# Patient Record
Sex: Female | Born: 1975 | Race: White | Hispanic: No | Smoking: Current every day smoker
Health system: Southern US, Community
[De-identification: ages and names within clinical notes are randomized; demographics above are authoritative.]

## PROBLEM LIST (undated history)

## (undated) DIAGNOSIS — G43009 Migraine without aura, not intractable, without status migrainosus: Secondary | ICD-10-CM

## (undated) DIAGNOSIS — F32A Depression, unspecified: Secondary | ICD-10-CM

## (undated) DIAGNOSIS — F329 Major depressive disorder, single episode, unspecified: Secondary | ICD-10-CM

## (undated) DIAGNOSIS — F331 Major depressive disorder, recurrent, moderate: Secondary | ICD-10-CM

## (undated) DIAGNOSIS — F419 Anxiety disorder, unspecified: Secondary | ICD-10-CM

## (undated) HISTORY — DX: Major depressive disorder, recurrent, moderate: F33.1

## (undated) HISTORY — DX: Migraine without aura, not intractable, without status migrainosus: G43.009

---

## 1999-03-15 ENCOUNTER — Other Ambulatory Visit: Admission: RE | Admit: 1999-03-15 | Discharge: 1999-03-15 | Payer: Self-pay | Admitting: Obstetrics and Gynecology

## 1999-09-17 ENCOUNTER — Inpatient Hospital Stay (HOSPITAL_COMMUNITY): Admission: AD | Admit: 1999-09-17 | Discharge: 1999-09-19 | Payer: Self-pay | Admitting: Obstetrics and Gynecology

## 2000-08-28 ENCOUNTER — Other Ambulatory Visit: Admission: RE | Admit: 2000-08-28 | Discharge: 2000-08-28 | Payer: Self-pay | Admitting: Obstetrics and Gynecology

## 2001-09-02 ENCOUNTER — Other Ambulatory Visit: Admission: RE | Admit: 2001-09-02 | Discharge: 2001-09-02 | Payer: Self-pay | Admitting: Obstetrics and Gynecology

## 2003-06-17 HISTORY — PX: TUBAL LIGATION: SHX77

## 2004-10-10 ENCOUNTER — Ambulatory Visit: Payer: Self-pay | Admitting: *Deleted

## 2004-10-24 ENCOUNTER — Ambulatory Visit: Payer: Self-pay | Admitting: Obstetrics and Gynecology

## 2005-01-28 ENCOUNTER — Ambulatory Visit: Payer: Self-pay | Admitting: Obstetrics & Gynecology

## 2005-01-28 ENCOUNTER — Other Ambulatory Visit: Admission: RE | Admit: 2005-01-28 | Discharge: 2005-01-28 | Payer: Self-pay | Admitting: Obstetrics & Gynecology

## 2005-02-13 ENCOUNTER — Ambulatory Visit: Payer: Self-pay | Admitting: Obstetrics and Gynecology

## 2005-09-11 ENCOUNTER — Ambulatory Visit: Payer: Self-pay | Admitting: Family Medicine

## 2014-05-09 ENCOUNTER — Emergency Department (HOSPITAL_COMMUNITY)
Admission: EM | Admit: 2014-05-09 | Discharge: 2014-05-09 | Disposition: A | Discharge status: Home / Self Care | Payer: 59 | Attending: Emergency Medicine | Admitting: Emergency Medicine

## 2014-05-09 ENCOUNTER — Emergency Department (HOSPITAL_COMMUNITY): Payer: 59

## 2014-05-09 ENCOUNTER — Encounter (HOSPITAL_COMMUNITY): Payer: Self-pay

## 2014-05-09 DIAGNOSIS — S0990XA Unspecified injury of head, initial encounter: Secondary | ICD-10-CM | POA: Diagnosis present

## 2014-05-09 DIAGNOSIS — Y9389 Activity, other specified: Secondary | ICD-10-CM | POA: Diagnosis not present

## 2014-05-09 DIAGNOSIS — F419 Anxiety disorder, unspecified: Secondary | ICD-10-CM | POA: Diagnosis not present

## 2014-05-09 DIAGNOSIS — S0003XA Contusion of scalp, initial encounter: Secondary | ICD-10-CM | POA: Diagnosis not present

## 2014-05-09 DIAGNOSIS — Y998 Other external cause status: Secondary | ICD-10-CM | POA: Insufficient documentation

## 2014-05-09 DIAGNOSIS — F329 Major depressive disorder, single episode, unspecified: Secondary | ICD-10-CM | POA: Insufficient documentation

## 2014-05-09 DIAGNOSIS — Z720 Tobacco use: Secondary | ICD-10-CM | POA: Insufficient documentation

## 2014-05-09 DIAGNOSIS — S80811A Abrasion, right lower leg, initial encounter: Secondary | ICD-10-CM

## 2014-05-09 DIAGNOSIS — S92022A Displaced fracture of anterior process of left calcaneus, initial encounter for closed fracture: Secondary | ICD-10-CM | POA: Insufficient documentation

## 2014-05-09 DIAGNOSIS — S80212A Abrasion, left knee, initial encounter: Secondary | ICD-10-CM | POA: Diagnosis not present

## 2014-05-09 DIAGNOSIS — S199XXA Unspecified injury of neck, initial encounter: Secondary | ICD-10-CM | POA: Diagnosis not present

## 2014-05-09 DIAGNOSIS — Z79899 Other long term (current) drug therapy: Secondary | ICD-10-CM | POA: Insufficient documentation

## 2014-05-09 DIAGNOSIS — Y92410 Unspecified street and highway as the place of occurrence of the external cause: Secondary | ICD-10-CM | POA: Diagnosis not present

## 2014-05-09 DIAGNOSIS — S92002A Unspecified fracture of left calcaneus, initial encounter for closed fracture: Secondary | ICD-10-CM

## 2014-05-09 HISTORY — DX: Anxiety disorder, unspecified: F41.9

## 2014-05-09 HISTORY — DX: Major depressive disorder, single episode, unspecified: F32.9

## 2014-05-09 HISTORY — DX: Depression, unspecified: F32.A

## 2014-05-09 LAB — COMPREHENSIVE METABOLIC PANEL
ALT: 26 U/L (ref 0–35)
AST: 27 U/L (ref 0–37)
Albumin: 4.1 g/dL (ref 3.5–5.2)
Alkaline Phosphatase: 77 U/L (ref 39–117)
Anion gap: 12 (ref 5–15)
BUN: 11 mg/dL (ref 6–23)
CO2: 26 mEq/L (ref 19–32)
Calcium: 8.8 mg/dL (ref 8.4–10.5)
Chloride: 104 mEq/L (ref 96–112)
Creatinine, Ser: 0.63 mg/dL (ref 0.50–1.10)
GFR calc Af Amer: >90 mL/min (ref >90)
GFR calc non Af Amer: >90 mL/min (ref >90)
Glucose, Bld: 88 mg/dL (ref 70–99)
Potassium: 4.1 mEq/L (ref 3.7–5.3)
Sodium: 142 mEq/L (ref 137–147)
Total Bilirubin: 0.3 mg/dL (ref 0.3–1.2)
Total Protein: 7.1 g/dL (ref 6.0–8.3)

## 2014-05-09 LAB — CBC
HCT: 39.9 % (ref 36.0–46.0)
Hemoglobin: 13.2 g/dL (ref 12.0–15.0)
MCH: 32 pg (ref 26.0–34.0)
MCHC: 33.1 g/dL (ref 30.0–36.0)
MCV: 96.6 fL (ref 78.0–100.0)
Platelets: 268 10*3/uL (ref 150–400)
RBC: 4.13 MIL/uL (ref 3.87–5.11)
RDW: 13.1 % (ref 11.5–15.5)
WBC: 10.7 10*3/uL — ABNORMAL HIGH (ref 4.0–10.5)

## 2014-05-09 MED ORDER — OXYCODONE-ACETAMINOPHEN 5-325 MG PO TABS: 1.0000 | ORAL_TABLET | ORAL | Status: DC | PRN

## 2014-05-09 MED ORDER — IBUPROFEN 600 MG PO TABS: 600.0000 mg | ORAL_TABLET | Freq: Four times a day (QID) | ORAL | Status: DC | PRN

## 2014-05-09 MED ORDER — KETOROLAC TROMETHAMINE 30 MG/ML IJ SOLN
30.0000 mg | Freq: Once | INTRAMUSCULAR | Status: AC
  Administered 2014-05-09: 30 mg via INTRAVENOUS
  Filled 2014-05-09: qty 1

## 2014-05-09 MED ORDER — HYDROMORPHONE HCL 1 MG/ML IJ SOLN
1.0000 mg | Freq: Once | INTRAMUSCULAR | Status: AC
  Administered 2014-05-09: 1 mg via INTRAVENOUS
  Filled 2014-05-09: qty 1

## 2014-05-09 NOTE — ED Notes (Signed)
Per patient request spoke with pt work, [facility] in [place] to let them know she was not going to be there.
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
## 2014-05-09 NOTE — Progress Notes (Signed)
Orthopedic Tech Progress Note Patient Details:  Regina Duran November 17, 1975 [id number]  Ortho Devices Type of Ortho Device: Soft collar Ortho Device/Splint Location: neck Ortho Device/Splint Interventions: Application   Esterlene Atiyeh 05/09/2014, 10:10 AM

## 2014-05-09 NOTE — ED Notes (Signed)
Spoke with Pt.s friend Joelene Millin, and reported to her that pt. Is at [facility] and for her to contact her mother.  Joelene Millin verbalized understanding

## 2014-05-09 NOTE — Progress Notes (Signed)
Orthopedic Tech Progress Note Patient Details:  LEROY TRIM 21-May-1976 [id number]  Ortho Devices Type of Ortho Device: Ace wrap, Crutches, Post (short leg) splint Ortho Device/Splint Location: lle Ortho Device/Splint Interventions: Application   Harshal Sirmon 05/09/2014, 2:05 PM

## 2014-05-09 NOTE — ED Notes (Signed)
Ice Pak applied to head and lt. Foot/ankle
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
## 2015-03-15 ENCOUNTER — Encounter: Payer: Self-pay | Admitting: Neurology

## 2015-03-15 ENCOUNTER — Ambulatory Visit (INDEPENDENT_AMBULATORY_CARE_PROVIDER_SITE_OTHER): Payer: 59 | Admitting: Neurology

## 2015-03-15 VITALS — BP 98/62 | HR 60 | Resp 16 | Ht 64.0 in | Wt 122.0 lb

## 2015-03-15 DIAGNOSIS — R202 Paresthesia of skin: Secondary | ICD-10-CM

## 2015-03-15 DIAGNOSIS — M79609 Pain in unspecified limb: Secondary | ICD-10-CM | POA: Diagnosis not present
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
## 2015-03-15 NOTE — Patient Instructions (Signed)
Your exam looks normal which is reassuring.   We will await your records from [facility]. Let me review records and we will let you know if you need further work up. For now, let's play it by ear. Symptoms may still get better with time, however, with tingling/pins and needles, predictions are difficult. On the positive side, you have not had progressive symptoms.

## 2015-04-04 ENCOUNTER — Telehealth: Payer: Self-pay | Admitting: Neurology

## 2015-04-04 NOTE — Telephone Encounter (Signed)
Pt called and is wondering if this office received her medical records from [facility]. She was told by the physician that her care could not continue until then. She would like to know what her next step is . Please call and advise [phone]

## 2015-04-04 NOTE — Telephone Encounter (Signed)
Looks like some notes have been scanned into chart. How would you like to proceed?

## 2015-04-05 NOTE — Telephone Encounter (Signed)
I spoke to patient and she is aware of recommendation. She was appreciative of advice. She will discuss with PCP about getting referral but will call us back if needed.

## 2015-04-05 NOTE — Telephone Encounter (Signed)
I reviewed records. She had an appropriate w/u in the form of recent EMG which showed evidence of pinched nerve and an MRI of the neck, which showed some degenerative changes. The best next option is to see a spine doctor, through orthopedics or neurosurgery. She is encouraged to discuss with PCP also. If she wishes, I can make a referral.

## 2015-06-25 DIAGNOSIS — F32A Depression, unspecified: Secondary | ICD-10-CM | POA: Insufficient documentation

## 2015-11-20 ENCOUNTER — Telehealth: Payer: Self-pay | Admitting: Neurology

## 2015-11-21 NOTE — Telephone Encounter (Signed)
Medical records faxed to [facility] and [facility] at law dg
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
## 2016-11-20 DIAGNOSIS — M7711 Lateral epicondylitis, right elbow: Secondary | ICD-10-CM | POA: Diagnosis not present

## 2017-02-28 DIAGNOSIS — Z23 Encounter for immunization: Secondary | ICD-10-CM | POA: Diagnosis not present

## 2017-04-15 DIAGNOSIS — Z0000 Encounter for general adult medical examination without abnormal findings: Secondary | ICD-10-CM | POA: Diagnosis not present

## 2017-04-15 DIAGNOSIS — Z6822 Body mass index (BMI) 22.0-22.9, adult: Secondary | ICD-10-CM | POA: Diagnosis not present

## 2017-04-15 DIAGNOSIS — Z1231 Encounter for screening mammogram for malignant neoplasm of breast: Secondary | ICD-10-CM | POA: Diagnosis not present

## 2017-04-20 ENCOUNTER — Other Ambulatory Visit: Payer: Self-pay | Admitting: Medical

## 2017-04-20 DIAGNOSIS — Z1231 Encounter for screening mammogram for malignant neoplasm of breast: Secondary | ICD-10-CM

## 2017-05-20 ENCOUNTER — Ambulatory Visit
Admission: RE | Admit: 2017-05-20 | Discharge: 2017-05-20 | Disposition: A | Discharge status: Home / Self Care | Payer: 59 | Source: Ambulatory Visit | Attending: Medical | Admitting: Medical

## 2017-05-20 DIAGNOSIS — Z1231 Encounter for screening mammogram for malignant neoplasm of breast: Secondary | ICD-10-CM | POA: Diagnosis not present

## 2017-05-20 LAB — HM MAMMOGRAPHY: HM Mammogram: NORMAL (ref 0–4)

## 2017-06-16 HISTORY — PX: OTHER SURGICAL HISTORY: SHX169

## 2017-10-06 DIAGNOSIS — R5383 Other fatigue: Secondary | ICD-10-CM | POA: Diagnosis not present

## 2017-10-30 DIAGNOSIS — R1110 Vomiting, unspecified: Secondary | ICD-10-CM | POA: Diagnosis not present

## 2017-10-30 DIAGNOSIS — R1031 Right lower quadrant pain: Secondary | ICD-10-CM | POA: Diagnosis not present

## 2017-10-30 DIAGNOSIS — N83201 Unspecified ovarian cyst, right side: Secondary | ICD-10-CM | POA: Diagnosis not present

## 2017-11-06 DIAGNOSIS — M4802 Spinal stenosis, cervical region: Secondary | ICD-10-CM

## 2017-11-06 HISTORY — DX: Spinal stenosis, cervical region: M48.02

## 2018-01-01 DIAGNOSIS — N838 Other noninflammatory disorders of ovary, fallopian tube and broad ligament: Secondary | ICD-10-CM

## 2018-01-01 HISTORY — DX: Other noninflammatory disorders of ovary, fallopian tube and broad ligament: N83.8

## 2018-01-19 DIAGNOSIS — N83291 Other ovarian cyst, right side: Secondary | ICD-10-CM | POA: Diagnosis not present

## 2018-01-19 DIAGNOSIS — D270 Benign neoplasm of right ovary: Secondary | ICD-10-CM | POA: Diagnosis not present

## 2018-01-19 DIAGNOSIS — N83201 Unspecified ovarian cyst, right side: Secondary | ICD-10-CM | POA: Diagnosis not present

## 2018-04-21 DIAGNOSIS — Z6820 Body mass index (BMI) 20.0-20.9, adult: Secondary | ICD-10-CM | POA: Diagnosis not present

## 2018-04-21 DIAGNOSIS — Z01419 Encounter for gynecological examination (general) (routine) without abnormal findings: Secondary | ICD-10-CM | POA: Diagnosis not present

## 2018-04-21 DIAGNOSIS — Z0000 Encounter for general adult medical examination without abnormal findings: Secondary | ICD-10-CM | POA: Diagnosis not present

## 2018-04-21 LAB — HM PAP SMEAR: HM Pap smear: POSITIVE

## 2018-05-17 DIAGNOSIS — Z01419 Encounter for gynecological examination (general) (routine) without abnormal findings: Secondary | ICD-10-CM | POA: Diagnosis not present

## 2018-05-17 DIAGNOSIS — Z0000 Encounter for general adult medical examination without abnormal findings: Secondary | ICD-10-CM | POA: Diagnosis not present

## 2019-08-08 ENCOUNTER — Other Ambulatory Visit: Payer: Self-pay

## 2019-08-08 ENCOUNTER — Ambulatory Visit: Payer: 59 | Admitting: Nurse Practitioner

## 2019-08-08 ENCOUNTER — Encounter: Payer: Self-pay | Admitting: Nurse Practitioner

## 2019-08-08 VITALS — BP 98/68 | HR 124 | Temp 95.7°F | Resp 20 | Ht 63.0 in | Wt 125.0 lb

## 2019-08-08 DIAGNOSIS — R110 Nausea: Secondary | ICD-10-CM | POA: Insufficient documentation

## 2019-08-08 DIAGNOSIS — R002 Palpitations: Secondary | ICD-10-CM

## 2019-08-08 DIAGNOSIS — R9431 Abnormal electrocardiogram [ECG] [EKG]: Secondary | ICD-10-CM

## 2019-08-08 DIAGNOSIS — F331 Major depressive disorder, recurrent, moderate: Secondary | ICD-10-CM

## 2019-08-08 DIAGNOSIS — R5383 Other fatigue: Secondary | ICD-10-CM

## 2019-08-08 HISTORY — DX: Other fatigue: R53.83

## 2019-08-08 HISTORY — DX: Abnormal electrocardiogram (ECG) (EKG): R94.31

## 2019-08-08 HISTORY — DX: Nausea: R11.0

## 2019-08-08 MED ORDER — ONDANSETRON HCL 4 MG PO TABS: 4.0000 mg | ORAL_TABLET | Freq: Three times a day (TID) | ORAL | 0 refills | Status: DC | PRN

## 2019-08-08 MED ORDER — VIIBRYD 40 MG PO TABS: 40.0000 mg | ORAL_TABLET | Freq: Every day | ORAL | 0 refills | Status: DC

## 2019-08-08 NOTE — Assessment & Plan Note (Addendum)
Stat troponin levels collected, patient referred to cardiologist

## 2019-08-08 NOTE — Progress Notes (Signed)
Acute Office Visit  Subjective:    Patient ID: Regina Duran, female    DOB: [birth year], 44 y.o.   MRN: [id number]  Chief Complaint  Patient presents with  . Nausea  . Fatigue   Patient is in today for Patietnt  is a 8 old  female  She  is here for nausea/fatigue.  The patient's medications were reviewed and reconciled since the patient's last visit.  History details were provided by the patient. The history appears to be   Nausea: Patient complains of nausea. The nausea is described as moderate , and is  onset was about 4 weeks ago. Symptoms have been, increased lethargy, increase paulse, decrease appetite. Nothing makes it worse or better. The patient denies changes in life style, eating habit, or new stressors.   Fatigue: Patient complains of fatigue. Symptoms began about 3-4 weeks ago. [place]   Symptoms of her fatigue have been increase sleep, decrease apatite, and tachycardia Patient describes the following psychologic symptoms: depression.  Patient denies fever,   Past Medical History:  Diagnosis Date  . Anxiety   . Depression   . Major depressive disorder, recurrent, moderate ([place])   . Migraine without aura, not intractable, without status migrainosus     Past Surgical History:  Procedure Laterality Date  . Oophroectomy Right 06/16/2017   Right ovary removal  . TUBAL LIGATION  2005  . TUBAL LIGATION Bilateral 06/17/2003    Family History  Problem Relation Age of Onset  . Breast cancer Maternal Grandmother     Social History   Socioeconomic History  . Marital status: Legally Separated    Spouse name: Not on file  . Number of children: 3  . Years of education: 72  . Highest education level: Not on file  Occupational History  . Occupation: [employer]  Tobacco Use  . Smoking status: Former Smoker    Packs/day: 1.00    Types: Cigarettes    Quit date: 2019    Years since quitting: 2.1  . Smokeless tobacco: Never Used  Substance and Sexual Activity  .  Alcohol use: Not Currently  . Drug use: Never  . Sexual activity: Not on file  Other Topics Concern  . Not on file  Social History Narrative   ** Merged History Encounter **       Drinks a couple cups of coffee a day, also may have a soda or glass of tea a day   Social Determinants of Radio broadcast assistant Strain:   . Difficulty of Paying Living Expenses: Not on file  Food Insecurity:   . Worried About Charity fundraiser in the Last Year: Not on file  . Ran Out of Food in the Last Year: Not on file  Transportation Needs:   . Lack of Transportation (Medical): Not on file  . Lack of Transportation (Non-Medical): Not on file  Physical Activity:   . Days of Exercise per Week: Not on file  . Minutes of Exercise per Session: Not on file  Stress:   . Feeling of Stress : Not on file  Social Connections:   . Frequency of Communication with Friends and Family: Not on file  . Frequency of Social Gatherings with Friends and Family: Not on file  . Attends Religious Services: Not on file  . Active Member of Clubs or Organizations: Not on file  . Attends Archivist Meetings: Not on file  . Marital Status: Not on file  Intimate Partner  Violence:   . Fear of Current or Ex-Partner: Not on file  . Emotionally Abused: Not on file  . Physically Abused: Not on file  . Sexually Abused: Not on file    Outpatient Medications Prior to Visit  Medication Sig Dispense Refill  . buPROPion (WELLBUTRIN XL) 300 MG 24 hr tablet Take 300 mg by mouth daily.    . Iron, Ferrous Sulfate, 142 (45 Fe) MG TBCR Take 1 tablet by mouth daily.    Marland Kitchen acyclovir (ZOVIRAX) 200 MG capsule      No facility-administered medications prior to visit.    Allergies  Allergen Reactions  . Sulfa Antibiotics Nausea And Vomiting  . Sulfur Nausea Only    Review of Systems  Constitutional: Positive for activity change, appetite change, diaphoresis, fatigue, fever and unexpected weight change. Negative for  chills.  HENT: Negative for congestion, dental problem, drooling, ear discharge, ear pain, facial swelling, hearing loss, sinus pain, sneezing, sore throat and trouble swallowing.   Eyes: Negative for pain, discharge, redness and itching.  Respiratory: Negative for apnea, cough, choking, chest tightness and shortness of breath.   Cardiovascular: Negative for chest pain and palpitations.  Gastrointestinal: Positive for nausea. Negative for abdominal distention and abdominal pain.  Endocrine: Positive for heat intolerance.  Genitourinary: Negative for difficulty urinating.  Musculoskeletal: Negative for arthralgias and myalgias.  Skin: Negative for rash.  Neurological: Negative for headaches.  Psychiatric/Behavioral: Negative for agitation and confusion.       Objective:    Physical Exam Constitutional:      Appearance: Normal appearance.  HENT:     Head: Normocephalic.     Right Ear: Tympanic membrane, ear canal and external ear normal.     Left Ear: Tympanic membrane, ear canal and external ear normal.     Nose: Nose normal.     Mouth/Throat:     Mouth: Mucous membranes are moist.     Pharynx: Oropharynx is clear.  Eyes:     Conjunctiva/sclera: Conjunctivae normal.     Pupils: Pupils are equal, round, and reactive to light.  Cardiovascular:     Rate and Rhythm: Regular rhythm. Tachycardia present.     Heart sounds: Normal heart sounds.  Pulmonary:     Effort: Pulmonary effort is normal.     Breath sounds: Normal breath sounds.  Abdominal:     General: There is no distension.     Palpations: Abdomen is soft.     Tenderness: There is no abdominal tenderness.  Musculoskeletal:        General: Normal range of motion.     Cervical back: Normal range of motion and neck supple. No rigidity or tenderness.  Skin:    General: Skin is warm.     Findings: No bruising.  Neurological:     General: No focal deficit present.     Mental Status: She is alert.     Motor: No weakness.       Gait: Gait normal.  Psychiatric:        Mood and Affect: Mood normal.        Behavior: Behavior normal.     BP 98/68 (BP Location: Right Arm, Patient Position: Sitting)   Pulse (!) 124   Temp (!) 95.7 F (35.4 C) (Temporal)   Resp 20   Ht 5\' 3"  (1.6 m)   Wt 125 lb (56.7 kg)   SpO2 99%   BMI 22.14 kg/m  Wt Readings from Last 3 Encounters:  08/08/19 125 lb (56.7  kg)  03/15/15 122 lb (55.3 kg)    Health Maintenance Due  Topic Date Due  . HIV Screening  09/28/1988    There are no preventive care reminders to display for this patient.   Lab Results  Component Value Date   TSH 1.300 08/08/2019   Lab Results  Component Value Date   WBC 8.8 08/08/2019   HGB 14.3 08/08/2019   HCT 40.8 08/08/2019   MCV 92 08/08/2019   PLT 325 08/08/2019   Lab Results  Component Value Date   NA 144 08/08/2019   K 4.2 08/08/2019   CO2 24 08/08/2019   GLUCOSE 92 08/08/2019   BUN 10 08/08/2019   CREATININE 0.84 08/08/2019   BILITOT 0.3 08/08/2019   ALKPHOS 82 08/08/2019   AST 19 08/08/2019   ALT 8 08/08/2019   PROT 7.7 08/08/2019   ALBUMIN 4.7 08/08/2019   CALCIUM 10.1 08/08/2019   ANIONGAP 12 05/09/2014   No results found for: CHOL No results found for: HDL No results found for: LDLCALC No results found for: TRIG No results found for: CHOLHDL No results found for: HGBA1C     Assessment & Plan:   Problem List Items Addressed This Visit      Other   Nausea - Primary    Patient started on  Anti-nausea medication.  Continue to work on eating bland diet. Avoid spicy and greasy food until nausea subsides. Follow up with worsening symptoms   Rx for nausea medication sent to pharmacy,      Relevant Medications   ondansetron (ZOFRAN) 4 MG tablet   Other fatigue    New complain,  Labs drawn today.  TSH, CMP, B12, CBC, collected. Pending results      Relevant Orders   CBC with Differential/Platelet (Completed)   TSH (Completed)   Vitamin B12 (Completed)    Comprehensive metabolic panel (Completed)   Abnormal EKG    Stat troponin levels collected, patient referred to cardiologist      Relevant Orders   Troponin T (Completed)   Specimen status report (Completed)   Ambulatory referral to Cardiology   Stat Troponin T   Moderate episode of recurrent major depressive disorder ([place])    Well controlled.  No changes to medicines.  Continue to work on eating a healthy diet and exercise.        Relevant Medications   VIIBRYD 40 MG TABS   Palpitation    Well controlled.  No changes to medicines.  Continue to work on eating a healthy diet and exercise.        Relevant Medications   Iron, Ferrous Sulfate, 142 (45 Fe) MG TBCR   Other Relevant Orders   Troponin T (Completed)   Ambulatory referral to Cardiology   EKG 12-Lead (Completed)       Meds ordered this encounter  Medications  . VIIBRYD 40 MG TABS    Sig: Take 1 tablet (40 mg total) by mouth daily.    Dispense:  30 tablet    Refill:  0    Order Specific Question:   Supervising Provider    AnswerRochel Brome [id number]  . ondansetron (ZOFRAN) 4 MG tablet    Sig: Take 1 tablet (4 mg total) by mouth every 8 (eight) hours as needed for nausea or vomiting.    Dispense:  20 tablet    Refill:  0    Order Specific Question:   Supervising Provider    AnswerRochel Brome [phone]  Dmarco Baldus M Aquila Menzie, NP 

## 2019-08-08 NOTE — Assessment & Plan Note (Addendum)
Patient started on  Anti-nausea medication.  Continue to work on eating bland diet. Avoid spicy and greasy food until nausea subsides. Follow up with worsening symptoms   Rx for nausea medication sent to pharmacy,

## 2019-08-08 NOTE — Patient Instructions (Addendum)
EKG completed for increase heart rate, fatigue, nausea. Stat troponin. Cbc, TSH, CMP,  and B12 collected.  Fatigue If you have fatigue, you feel tired all the time and have a lack of energy or a lack of motivation. Fatigue may make it difficult to start or complete tasks because of exhaustion. In general, occasional or mild fatigue is often a normal response to activity or life. However, long-lasting (chronic) or extreme fatigue may be a symptom of a medical condition. Follow these instructions at home: General instructions  Watch your fatigue for any changes.  Go to bed and get up at the same time every day.  Avoid fatigue by pacing yourself during the day and getting enough sleep at night.  Maintain a healthy weight. Medicines  Take over-the-counter and prescription medicines only as told by your health care provider.  Take a multivitamin, if told by your health care provider.  Do not use herbal or dietary supplements unless they are approved by your health care provider. Activity   Exercise regularly, as told by your health care provider.  Use or practice techniques to help you relax, such as yoga, tai chi, meditation, or massage therapy. Eating and drinking   Avoid heavy meals in the evening.  Eat a well-balanced diet, which includes lean proteins, whole grains, plenty of fruits and vegetables, and low-fat dairy products.  Avoid consuming too much caffeine.  Avoid the use of alcohol.  Drink enough fluid to keep your urine pale yellow. Lifestyle  Change situations that cause you stress. Try to keep your work and personal schedule in balance.  Do not use any products that contain nicotine or tobacco, such as cigarettes and e-cigarettes. If you need help quitting, ask your health care provider.  Do not use drugs. Contact a health care provider if:  Your fatigue does not get better.  You have a fever.  You suddenly lose or gain weight.  You have  headaches.  You have trouble falling asleep or sleeping through the night.  You feel angry, guilty, anxious, or sad.  You are unable to have a bowel movement (constipation).  Your skin is dry.  You have swelling in your legs or another part of your body. Get help right away if:  You feel confused.  Your vision is blurry.  You feel faint or you pass out.  You have a severe headache.  You have severe pain in your abdomen, your back, or the area between your waist and hips (pelvis).  You have chest pain, shortness of breath, or an irregular or fast heartbeat.  You are unable to urinate, or you urinate less than normal.  You have abnormal bleeding, such as bleeding from the rectum, vagina, nose, lungs, or nipples.  You vomit blood.  You have thoughts about hurting yourself or others. If you ever feel like you may hurt yourself or others, or have thoughts about taking your own life, get help right away. You can go to your nearest emergency department or call:  Your local emergency services (911 in the [place]  A suicide crisis helpline, such as the [place] at 929-847-5843. This is open 24 hours a day. Summary  If you have fatigue, you feel tired all the time and have a lack of energy or a lack of motivation.  Fatigue may make it difficult to start or complete tasks because of exhaustion.  Long-lasting (chronic) or extreme fatigue may be a symptom of a medical condition.  Exercise regularly,  as told by your health care provider.  Change situations that cause you stress. Try to keep your work and personal schedule in balance. This information is not intended to replace advice given to you by your health care provider. Make sure you discuss any questions you have with your health care provider. Document Revised: 12/22/2018 Document Reviewed: 02/25/2017 Elsevier Patient Education  [place].  Nausea, Adult Nausea is feeling sick to  your stomach or feeling that you are about to throw up (vomit). Feeling sick to your stomach is usually not serious, but it may be an early sign of a more serious medical problem. As you feel sicker to your stomach, you may throw up. If you throw up, or if you are not able to drink enough fluids, there is a risk that you may lose too much water in your body (get dehydrated). If you lose too much water in your body, you may:  Feel tired.  Feel thirsty.  Have a dry mouth.  Have cracked lips.  Go pee (urinate) less often. Older adults and people who have other diseases or a weak body defense system (immune system) have a higher risk of losing too much water in the body. The main goals of treating this condition are:  To relieve your nausea.  To ensure your nausea occurs less often.  To prevent throwing up and losing too much fluid. Follow these instructions at home: Watch your symptoms for any changes. Tell your doctor about them. Follow these instructions as told by your doctor. Eating and drinking      Take an ORS (oral rehydration solution). This is a drink that is sold at pharmacies and stores.  Drink clear fluids in small amounts as you are able. These include: ? Water. ? Ice chips. ? Fruit juice that has water added (diluted fruit juice). ? Low-calorie sports drinks.  Eat bland, easy-to-digest foods in small amounts as you are able, such as: ? Bananas. ? Applesauce. ? Rice. ? Low-fat (lean) meats. ? Toast. ? Crackers.  Avoid drinking fluids that have a lot of sugar or caffeine in them. This includes energy drinks, sports drinks, and soda.  Avoid alcohol.  Avoid spicy or fatty foods. General instructions  Take over-the-counter and prescription medicines only as told by your doctor.  Rest at home while you get better.  Drink enough fluid to keep your pee (urine) pale yellow.  Take slow and deep breaths when you feel sick to your stomach.  Avoid food or  things that have strong smells.  Wash your hands often with soap and water. If you cannot use soap and water, use hand sanitizer.  Make sure that all people in your home wash their hands well and often.  Keep all follow-up visits as told by your doctor. This is important. Contact a doctor if:  You feel sicker to your stomach.  You feel sick to your stomach for more than 2 days.  You throw up.  You are not able to drink fluids without throwing up.  You have new symptoms.  You have a fever.  You have a headache.  You have muscle cramps.  You have a rash.  You have pain while peeing.  You feel light-headed or dizzy. Get help right away if:  You have pain in your chest, neck, arm, or jaw.  You feel very weak or you pass out (faint).  You have throw up that is bright red or looks like coffee grounds.  You have bloody or black poop (stools) or poop that looks like tar.  You have a very bad headache, a stiff neck, or both.  You have very bad pain, cramping, or bloating in your belly (abdomen).  You have trouble breathing or you are breathing very quickly.  Your heart is beating very quickly.  Your skin feels cold and clammy.  You feel confused.  You have signs of losing too much water in your body, such as: ? Dark pee, very little pee, or no pee. ? Cracked lips. ? Dry mouth. ? Sunken eyes. ? Sleepiness. ? Weakness. These symptoms may be an emergency. Do not wait to see if the symptoms will go away. Get medical help right away. Call your local emergency services (911 in the [place] Do not drive yourself to the hospital. Summary  Nausea is feeling sick to your stomach or feeling that you are about to throw up (vomit).  If you throw up, or if you are not able to drink enough fluids, there is a risk that you may lose too much water in your body (get dehydrated).  Eat and drink what your doctor tells you. Take over-the-counter and prescription medicines only as  told by your doctor.  Contact a doctor right away if your symptoms get worse or you have new symptoms.  Keep all follow-up visits as told by your doctor. This is important. This information is not intended to replace advice given to you by your health care provider. Make sure you discuss any questions you have with your health care provider. Document Revised: 11/10/2017 Document Reviewed: 11/10/2017 Elsevier Patient Education  [place].

## 2019-08-08 NOTE — Assessment & Plan Note (Addendum)
New complain,  Labs drawn today.  TSH, CMP, B12, CBC, collected. Pending results

## 2019-08-09 ENCOUNTER — Telehealth: Payer: Self-pay | Admitting: Nurse Practitioner

## 2019-08-09 DIAGNOSIS — F331 Major depressive disorder, recurrent, moderate: Secondary | ICD-10-CM | POA: Insufficient documentation

## 2019-08-09 DIAGNOSIS — R002 Palpitations: Secondary | ICD-10-CM | POA: Insufficient documentation

## 2019-08-09 HISTORY — DX: Major depressive disorder, recurrent, moderate: F33.1

## 2019-08-09 HISTORY — DX: Palpitations: R00.2

## 2019-08-09 LAB — CBC WITH DIFFERENTIAL/PLATELET
Basophils Absolute: 0.1 10*3/uL (ref 0.0–0.2)
Basos: 1 %
EOS (ABSOLUTE): 0 10*3/uL (ref 0.0–0.4)
Eos: 1 %
Hematocrit: 40.8 % (ref 34.0–46.6)
Hemoglobin: 14.3 g/dL (ref 11.1–15.9)
Immature Grans (Abs): 0 10*3/uL (ref 0.0–0.1)
Immature Granulocytes: 0 %
Lymphocytes Absolute: 2.1 10*3/uL (ref 0.7–3.1)
Lymphs: 24 %
MCH: 32.1 pg (ref 26.6–33.0)
MCHC: 35 g/dL (ref 31.5–35.7)
MCV: 92 fL (ref 79–97)
Monocytes Absolute: 0.8 10*3/uL (ref 0.1–0.9)
Monocytes: 9 %
Neutrophils Absolute: 5.8 10*3/uL (ref 1.4–7.0)
Neutrophils: 65 %
Platelets: 325 10*3/uL (ref 150–450)
RBC: 4.45 x10E6/uL (ref 3.77–5.28)
RDW: 11.9 % (ref 11.7–15.4)
WBC: 8.8 10*3/uL (ref 3.4–10.8)

## 2019-08-09 LAB — SPECIMEN STATUS REPORT

## 2019-08-09 LAB — COMPREHENSIVE METABOLIC PANEL
ALT: 8 IU/L (ref 0–32)
AST: 19 IU/L (ref 0–40)
Albumin/Globulin Ratio: 1.6 (ref 1.2–2.2)
Albumin: 4.7 g/dL (ref 3.8–4.8)
Alkaline Phosphatase: 82 IU/L (ref 39–117)
BUN/Creatinine Ratio: 12 (ref 9–23)
BUN: 10 mg/dL (ref 6–24)
Bilirubin Total: 0.3 mg/dL (ref 0.0–1.2)
CO2: 24 mmol/L (ref 20–29)
Calcium: 10.1 mg/dL (ref 8.7–10.2)
Chloride: 103 mmol/L (ref 96–106)
Creatinine, Ser: 0.84 mg/dL (ref 0.57–1.00)
GFR calc Af Amer: 97 mL/min/{1.73_m2} (ref >59)
GFR calc non Af Amer: 84 mL/min/{1.73_m2} (ref >59)
Globulin, Total: 3 g/dL (ref 1.5–4.5)
Glucose: 92 mg/dL (ref 65–99)
Potassium: 4.2 mmol/L (ref 3.5–5.2)
Sodium: 144 mmol/L (ref 134–144)
Total Protein: 7.7 g/dL (ref 6.0–8.5)

## 2019-08-09 LAB — TSH: TSH: 1.3 u[IU]/mL (ref 0.450–4.500)

## 2019-08-09 LAB — VITAMIN B12: Vitamin B-12: 487 pg/mL (ref 232–1245)

## 2019-08-09 LAB — TROPONIN T

## 2019-08-09 NOTE — Telephone Encounter (Signed)
Patient c/o of ongoing nausea but heart rate is back to normal. Patient advised to go to ED with elevated heart rate, Troponin results pending. Pt educated to take Nausea medication as ordered. Pt verbalize understanding. No changes to plan of care

## 2019-08-09 NOTE — Progress Notes (Signed)
Please call patient , let her know if she has any more  symptoms of elevated heart rate to please go to the ED. Troponin results is still pending. Thanks  OI

## 2019-08-11 ENCOUNTER — Other Ambulatory Visit: Payer: Self-pay | Admitting: Nurse Practitioner

## 2019-08-11 NOTE — Progress Notes (Signed)
Referral to cardiology sent out for abnormal EKG and elevated heart rate .

## 2019-08-17 NOTE — Assessment & Plan Note (Signed)
Well controlled. No changes to medicines.  Continue to work on eating a healthy diet and exercise.    

## 2019-08-24 ENCOUNTER — Ambulatory Visit: Payer: 59 | Admitting: Cardiology

## 2019-08-24 ENCOUNTER — Other Ambulatory Visit: Payer: Self-pay

## 2019-08-24 ENCOUNTER — Ambulatory Visit (INDEPENDENT_AMBULATORY_CARE_PROVIDER_SITE_OTHER): Payer: 59

## 2019-08-24 ENCOUNTER — Encounter: Payer: Self-pay | Admitting: Cardiology

## 2019-08-24 DIAGNOSIS — R002 Palpitations: Secondary | ICD-10-CM

## 2019-08-24 DIAGNOSIS — Z87891 Personal history of nicotine dependence: Secondary | ICD-10-CM

## 2019-08-24 DIAGNOSIS — R011 Cardiac murmur, unspecified: Secondary | ICD-10-CM

## 2019-08-24 DIAGNOSIS — R0989 Other specified symptoms and signs involving the circulatory and respiratory systems: Secondary | ICD-10-CM

## 2019-08-24 DIAGNOSIS — R000 Tachycardia, unspecified: Secondary | ICD-10-CM

## 2019-08-24 HISTORY — DX: Palpitations: R00.2

## 2019-08-24 HISTORY — DX: Personal history of nicotine dependence: Z87.891

## 2019-08-24 HISTORY — DX: Other specified symptoms and signs involving the circulatory and respiratory systems: R09.89

## 2019-08-24 HISTORY — DX: Cardiac murmur, unspecified: R01.1

## 2019-08-24 HISTORY — DX: Tachycardia, unspecified: R00.0

## 2019-08-24 NOTE — Patient Instructions (Signed)
Medication Instructions:  No medication changes.   *If you need a refill on your cardiac medications before your next appointment, please call your pharmacy*   Lab Work: None orderd. If you have labs (blood work) drawn today and your tests are completely normal, you will receive your results only by: Marland Kitchen MyChart Message (if you have MyChart) OR . A paper copy in the mail If you have any lab test that is abnormal or we need to change your treatment, we will call you to review the results.   Testing/Procedures: Your physician has requested that you have an echocardiogram. Echocardiography is a painless test that uses sound waves to create images of your heart. It provides your doctor with information about the size and shape of your heart and how well your heart's chambers and valves are working. This procedure takes approximately one hour. There are no restrictions for this procedure.   Your physician has requested that you have an abdominal aorta duplex. During this test, an ultrasound is used to evaluate the aorta. Allow 30 minutes for this exam. Do not eat after midnight the day before and avoid carbonated beverages   WHY IS MY DOCTOR PRESCRIBING ZIO? The Zio system is proven and trusted by physicians to detect and diagnose irregular heart rhythms -- and has been prescribed to hundreds of thousands of patients.  The FDA has cleared the Zio system to monitor for many different kinds of irregular heart rhythms. In a study, physicians were able to reach a diagnosis 90% of the time with the Zio system1.  You can wear the Zio monitor -- a small, discreet, comfortable patch -- during your normal day-to-day activity, including while you sleep, shower, and exercise, while it records every single heartbeat for analysis.  1Barrett, P., et al. Comparison of 24 Hour Holter Monitoring Versus 14 Day Novel Adhesive Patch Electrocardiographic Monitoring. [place], 2014.  ZIO VS.  HOLTER MONITORING The Zio monitor can be comfortably worn for up to 14 days. Holter monitors can be worn for 24 to 48 hours, limiting the time to record any irregular heart rhythms you may have. Zio is able to capture data for the 51% of patients who have their first symptom-triggered arrhythmia after 48 hours.1  LIVE WITHOUT RESTRICTIONS The Zio ambulatory cardiac monitor is a small, unobtrusive, and water-resistant patch--you might even forget you're wearing it. The Zio monitor records and stores every beat of your heart, whether you're sleeping, working out, or showering.  Wear the monitor for 2 weeks. Remove 09/06/19.  Follow-Up: At [facility], you and your health needs are our priority.  As part of our continuing mission to provide you with exceptional heart care, we have created designated Provider Care Teams.  These Care Teams include your primary Cardiologist (physician) and Advanced Practice Providers (APPs -  Physician Assistants and Nurse Practitioners) who all work together to provide you with the care you need, when you need it.  We recommend signing up for the patient portal called "MyChart".  Sign up information is provided on this After Visit Summary.  MyChart is used to connect with patients for Virtual Visits (Telemedicine).  Patients are able to view lab/test results, encounter notes, upcoming appointments, etc.  Non-urgent messages can be sent to your provider as well.   To learn more about what you can do with MyChart, go to [email or web address].    Your next appointment:   1 month(s)  The format for your next appointment:   In Person  Provider:   Jyl Heinz, MD   Other Instructions  Echocardiogram An echocardiogram is a procedure that uses painless sound waves (ultrasound) to produce an image of the heart. Images from an echocardiogram can provide important information about:  Signs of coronary artery disease (CAD).  Aneurysm detection. An aneurysm is a  weak or damaged part of an artery wall that bulges out from the normal force of blood pumping through the body.  Heart size and shape. Changes in the size or shape of the heart can be associated with certain conditions, including heart failure, aneurysm, and CAD.  Heart muscle function.  Heart valve function.  Signs of a past heart attack.  Fluid buildup around the heart.  Thickening of the heart muscle.  A tumor or infectious growth around the heart valves. Tell a health care provider about:  Any allergies you have.  All medicines you are taking, including vitamins, herbs, eye drops, creams, and over-the-counter medicines.  Any blood disorders you have.  Any surgeries you have had.  Any medical conditions you have.  Whether you are pregnant or may be pregnant. What are the risks? Generally, this is a safe procedure. However, problems may occur, including:  Allergic reaction to dye (contrast) that may be used during the procedure. What happens before the procedure? No specific preparation is needed. You may eat and drink normally. What happens during the procedure?   An IV tube may be inserted into one of your veins.  You may receive contrast through this tube. A contrast is an injection that improves the quality of the pictures from your heart.  A gel will be applied to your chest.  A wand-like tool (transducer) will be moved over your chest. The gel will help to transmit the sound waves from the transducer.  The sound waves will harmlessly bounce off of your heart to allow the heart images to be captured in real-time motion. The images will be recorded on a computer. The procedure may vary among health care providers and hospitals. What happens after the procedure?  You may return to your normal, everyday life, including diet, activities, and medicines, unless your health care provider tells you not to do that. Summary  An echocardiogram is a procedure that uses  painless sound waves (ultrasound) to produce an image of the heart.  Images from an echocardiogram can provide important information about the size and shape of your heart, heart muscle function, heart valve function, and fluid buildup around your heart.  You do not need to do anything to prepare before this procedure. You may eat and drink normally.  After the echocardiogram is completed, you may return to your normal, everyday life, unless your health care provider tells you not to do that. This information is not intended to replace advice given to you by your health care provider. Make sure you discuss any questions you have with your health care provider. Document Revised: 09/23/2018 Document Reviewed: 07/05/2016 Elsevier Patient Education  [place].

## 2019-08-24 NOTE — Addendum Note (Signed)
Addended by: Jerl Santos R on: 08/24/2019 03:58 PM   Modules accepted: Orders

## 2019-08-24 NOTE — Progress Notes (Signed)
Cardiology Office Note:    Date:  08/24/2019   ID:  Regina Duran, DOB [birth year], MRN [id number]  PCP:  Regina Lynn, NP  Cardiologist:  Regina Lindau, MD   Referring MD: Regina Lynn, NP    ASSESSMENT:    1. Palpitations   2. Sinus tachycardia   3. Cardiac murmur   4. Prominent abdominal aortic pulsation   5. Ex-smoker    PLAN:    In order of problems listed above:  1. Palpitations: These are of concern to the patient.  I noted that her TSH and CBC was unremarkable.  We will do a 2-week ZIO monitoring.  I told her to keep her self well-hydrated.  I do not want to start her on any medication like beta-blocker because her blood pressure is borderline and because I do not want the medicine to mask the monitor findings. 2. Cardiac murmur: Echocardiogram will be done to assess murmur heard on auscultation. 3. Ex-smoker: She promises to never to go back to smoking 4. Abnormal abdominal pulsations we will get abdominal aortic ultrasound to rule out possibilities of aneurysm in view of smoking history 5. Follow-up appointment in a month or earlier if she has any concerns.   Medication Adjustments/Labs and Tests Ordered: Current medicines are reviewed at length with the patient today.  Concerns regarding medicines are outlined above.  No orders of the defined types were placed in this encounter.  No orders of the defined types were placed in this encounter.    History of Present Illness:    Regina Duran is a 45 y.o. female who is being seen today for the evaluation of palpitations at the request of Regina Lynn, NP.  Patient is a pleasant 44 year old female.  She has past medical history that is not very significant from a cardiac standpoint.  The patient mentions to me that she has been noticing her heart rate to be elevated.  She feels palpitations at times.  No chest pain orthopnea or PND.  At the time of my evaluation, the patient is alert awake  oriented and in no distress.  She leads a sedentary lifestyle.  No dizziness or any syncope.  Past Medical History:  Diagnosis Date  . Anxiety   . Depression   . Major depressive disorder, recurrent, moderate ([place])   . Migraine without aura, not intractable, without status migrainosus     Past Surgical History:  Procedure Laterality Date  . Oophroectomy Right 06/16/2017   Right ovary removal  . TUBAL LIGATION  2005  . TUBAL LIGATION Bilateral 06/17/2003    Current Medications: Current Meds  Medication Sig  . acyclovir (ZOVIRAX) 200 MG capsule Take 200 mg by mouth once as needed.  Marland Kitchen buPROPion (WELLBUTRIN XL) 300 MG 24 hr tablet Take 300 mg by mouth daily.  . Iron, Ferrous Sulfate, 142 (45 Fe) MG TBCR Take 1 tablet by mouth daily.  Marland Kitchen VIIBRYD 40 MG TABS Take 1 tablet (40 mg total) by mouth daily.     Allergies:   Sulfa antibiotics and Sulfur   Social History   Socioeconomic History  . Marital status: Legally Separated    Spouse name: Not on file  . Number of children: 3  . Years of education: 43  . Highest education level: Not on file  Occupational History  . Occupation: [employer]  Tobacco Use  . Smoking status: Former Smoker    Packs/day: 1.00    Types: Cigarettes  Quit date: 2019    Years since quitting: 2.1  . Smokeless tobacco: Never Used  Substance and Sexual Activity  . Alcohol use: Not Currently  . Drug use: Never  . Sexual activity: Not on file  Other Topics Concern  . Not on file  Social History Narrative   ** Merged History Encounter **       Drinks a couple cups of coffee a day, also may have a soda or glass of tea a day   Social Determinants of Radio broadcast assistant Strain:   . Difficulty of Paying Living Expenses: Not on file  Food Insecurity:   . Worried About Charity fundraiser in the Last Year: Not on file  . Ran Out of Food in the Last Year: Not on file  Transportation Needs:   . Lack of Transportation (Medical): Not on file  .  Lack of Transportation (Non-Medical): Not on file  Physical Activity:   . Days of Exercise per Week: Not on file  . Minutes of Exercise per Session: Not on file  Stress:   . Feeling of Stress : Not on file  Social Connections:   . Frequency of Communication with Friends and Family: Not on file  . Frequency of Social Gatherings with Friends and Family: Not on file  . Attends Religious Services: Not on file  . Active Member of Clubs or Organizations: Not on file  . Attends Archivist Meetings: Not on file  . Marital Status: Not on file     Family History: The patient's family history includes Breast cancer in her maternal grandmother.  ROS:   Please see the history of present illness.    All other systems reviewed and are negative.  EKGs/Labs/Other Studies Reviewed:    The following studies were reviewed today: I discussed my findings with the patient at length.  Also her lab work including TSH was unremarkable.   Recent Labs: 08/08/2019: ALT 8; BUN 10; Creatinine, Ser 0.84; Hemoglobin 14.3; Platelets 325; Potassium 4.2; Sodium 144; TSH 1.300  Recent Lipid Panel No results found for: CHOL, TRIG, HDL, CHOLHDL, VLDL, LDLCALC, LDLDIRECT  Physical Exam:    VS:  BP 108/74   Pulse 99   Ht 5\' 4"  (1.626 m)   Wt 124 lb (56.2 kg)   SpO2 99%   BMI 21.28 kg/m     Wt Readings from Last 3 Encounters:  08/24/19 124 lb (56.2 kg)  08/08/19 125 lb (56.7 kg)  03/15/15 122 lb (55.3 kg)     GEN: Patient is in no acute distress HEENT: Normal NECK: No JVD; No carotid bruits LYMPHATICS: No lymphadenopathy CARDIAC: S1 S2 regular, 2/6 systolic murmur at the apex. RESPIRATORY:  Clear to auscultation without rales, wheezing or rhonchi  ABDOMEN: Soft, non-tender, non-distended.  Prominent pulsations in the descending aorta/abdominal aortic area. MUSCULOSKELETAL:  No edema; No deformity  SKIN: Warm and dry NEUROLOGIC:  Alert and oriented x 3 PSYCHIATRIC:  Normal affect     Signed, Regina Lindau, MD  08/24/2019 2:33 PM    [place]

## 2019-10-03 ENCOUNTER — Other Ambulatory Visit: Payer: Self-pay

## 2019-10-03 DIAGNOSIS — F331 Major depressive disorder, recurrent, moderate: Secondary | ICD-10-CM

## 2019-10-03 MED ORDER — VIIBRYD 40 MG PO TABS: 40.0000 mg | ORAL_TABLET | Freq: Every day | ORAL | 5 refills | Status: DC

## 2019-10-06 ENCOUNTER — Other Ambulatory Visit: Payer: 59

## 2019-10-07 ENCOUNTER — Ambulatory Visit: Payer: 59 | Admitting: Cardiology

## 2020-01-23 ENCOUNTER — Other Ambulatory Visit: Payer: Self-pay

## 2020-01-23 MED ORDER — BUPROPION HCL ER (XL) 300 MG PO TB24: 300.0000 mg | ORAL_TABLET | Freq: Every day | ORAL | 0 refills | Status: DC

## 2020-01-23 NOTE — Telephone Encounter (Signed)
Please try to schedule with Dr. Holly Bodily or Larene Beach.

## 2020-01-25 ENCOUNTER — Other Ambulatory Visit: Payer: Self-pay | Admitting: Legal Medicine

## 2020-01-25 ENCOUNTER — Other Ambulatory Visit: Payer: Self-pay

## 2020-03-06 ENCOUNTER — Telehealth: Payer: Self-pay | Admitting: Cardiology

## 2020-03-06 NOTE — Telephone Encounter (Signed)
     Pt would like to switch from Dr. Geraldo Pitter to Dr. Agustin Cree. She said she saw Dr. Agustin Cree before and would like to stay with him.

## 2020-03-06 NOTE — Telephone Encounter (Signed)
ok 

## 2020-03-07 NOTE — Telephone Encounter (Signed)
Fine with me

## 2020-03-15 ENCOUNTER — Ambulatory Visit (INDEPENDENT_AMBULATORY_CARE_PROVIDER_SITE_OTHER): Payer: 59 | Admitting: Cardiology

## 2020-03-15 ENCOUNTER — Other Ambulatory Visit: Payer: Self-pay

## 2020-03-15 ENCOUNTER — Ambulatory Visit (INDEPENDENT_AMBULATORY_CARE_PROVIDER_SITE_OTHER): Payer: 59

## 2020-03-15 ENCOUNTER — Ambulatory Visit: Payer: 59 | Admitting: Cardiology

## 2020-03-15 ENCOUNTER — Encounter: Payer: Self-pay | Admitting: Cardiology

## 2020-03-15 VITALS — BP 120/62 | HR 70 | Ht 64.0 in | Wt 117.0 lb

## 2020-03-15 DIAGNOSIS — R000 Tachycardia, unspecified: Secondary | ICD-10-CM | POA: Diagnosis not present

## 2020-03-15 DIAGNOSIS — R002 Palpitations: Secondary | ICD-10-CM | POA: Diagnosis not present

## 2020-03-15 DIAGNOSIS — R011 Cardiac murmur, unspecified: Secondary | ICD-10-CM | POA: Diagnosis not present

## 2020-03-15 NOTE — Patient Instructions (Signed)
Medication Instructions:  Your physician recommends that you continue on your current medications as directed. Please refer to the Current Medication list given to you today.  *If you need a refill on your cardiac medications before your next appointment, please call your pharmacy*   Lab Work: None If you have labs (blood work) drawn today and your tests are completely normal, you will receive your results only by: . MyChart Message (if you have MyChart) OR . A paper copy in the mail If you have any lab test that is abnormal or we need to change your treatment, we will call you to review the results.   Testing/Procedures: Your physician has requested that you have an echocardiogram. Echocardiography is a painless test that uses sound waves to create images of your heart. It provides your doctor with information about the size and shape of your heart and how well your heart's chambers and valves are working. This procedure takes approximately one hour. There are no restrictions for this procedure.  A zio monitor was ordered today. It will remain on for 7 days. You will then return monitor and event diary in provided box. It takes 1-2 weeks for report to be downloaded and returned to us. We will call you with the results. If monitor falls off or has orange flashing light, please call Zio for further instructions.      Follow-Up: At [facility], you and your health needs are our priority.  As part of our continuing mission to provide you with exceptional heart care, we have created designated Provider Care Teams.  These Care Teams include your primary Cardiologist (physician) and Advanced Practice Providers (APPs -  Physician Assistants and Nurse Practitioners) who all work together to provide you with the care you need, when you need it.  We recommend signing up for the patient portal called "MyChart".  Sign up information is provided on this After Visit Summary.  MyChart is used to connect  with patients for Virtual Visits (Telemedicine).  Patients are able to view lab/test results, encounter notes, upcoming appointments, etc.  Non-urgent messages can be sent to your provider as well.   To learn more about what you can do with MyChart, go to [email or web address].    Your next appointment:   2 month(s)  The format for your next appointment:   In Person  Provider:   Robert Krasowski, MD   Other Instructions   Echocardiogram An echocardiogram is a procedure that uses painless sound waves (ultrasound) to produce an image of the heart. Images from an echocardiogram can provide important information about:  Signs of coronary artery disease (CAD).  Aneurysm detection. An aneurysm is a weak or damaged part of an artery wall that bulges out from the normal force of blood pumping through the body.  Heart size and shape. Changes in the size or shape of the heart can be associated with certain conditions, including heart failure, aneurysm, and CAD.  Heart muscle function.  Heart valve function.  Signs of a past heart attack.  Fluid buildup around the heart.  Thickening of the heart muscle.  A tumor or infectious growth around the heart valves. Tell a health care provider about:  Any allergies you have.  All medicines you are taking, including vitamins, herbs, eye drops, creams, and over-the-counter medicines.  Any blood disorders you have.  Any surgeries you have had.  Any medical conditions you have.  Whether you are pregnant or may be pregnant. What are the risks? Generally,   Generally, this is a safe procedure. However, problems may occur, including:  Allergic reaction to dye (contrast) that may be used during the procedure. What happens before the procedure? No specific preparation is needed. You may eat and drink normally. What happens during the procedure?   An IV tube may be inserted into one of your veins.  You may receive contrast through this  tube. A contrast is an injection that improves the quality of the pictures from your heart.  A gel will be applied to your chest.  A wand-like tool (transducer) will be moved over your chest. The gel will help to transmit the sound waves from the transducer.  The sound waves will harmlessly bounce off of your heart to allow the heart images to be captured in real-time motion. The images will be recorded on a computer. The procedure may vary among health care providers and hospitals. What happens after the procedure?  You may return to your normal, everyday life, including diet, activities, and medicines, unless your health care provider tells you not to do that. Summary  An echocardiogram is a procedure that uses painless sound waves (ultrasound) to produce an image of the heart.  Images from an echocardiogram can provide important information about the size and shape of your heart, heart muscle function, heart valve function, and fluid buildup around your heart.  You do not need to do anything to prepare before this procedure. You may eat and drink normally.  After the echocardiogram is completed, you may return to your normal, everyday life, unless your health care provider tells you not to do that. This information is not intended to replace advice given to you by your health care provider. Make sure you discuss any questions you have with your health care provider. Document Revised: 09/23/2018 Document Reviewed: 07/05/2016 Elsevier Patient Education  [place].

## 2020-03-15 NOTE — Progress Notes (Signed)
Cardiology Office Note:    Date:  03/15/2020   ID:  Regina Duran, DOB [birth year], MRN [id number]  PCP:  Ivy Lynn, NP  Cardiologist:  Jenne Campus, MD    Referring MD: Ivy Lynn, NP   Chief Complaint  Patient presents with  . Follow-up    monitor  . Tachycardia  Did have I have palpitations again  History of Present Illness:    Regina Duran is a 44 y.o. female she is being evaluated in our office in March because of palpitations, she did wear Zio patch which showed only some PVCs.  She presented again to the office with chief complaint of palpitations palpitations on and off happening for last month or so.  Lasting for up to a few hours.  He tells me that her palpitations are irregular and typically abrupt onset, gradual offset.  She denies have any chest pain tightness squeezing pressure burning in his chest associated with this sensation no dizziness or passing out.  Past Medical History:  Diagnosis Date  . Anxiety   . Depression   . Major depressive disorder, recurrent, moderate ([place])   . Migraine without aura, not intractable, without status migrainosus     Past Surgical History:  Procedure Laterality Date  . Oophroectomy Right 06/16/2017   Right ovary removal  . TUBAL LIGATION  2005  . TUBAL LIGATION Bilateral 06/17/2003    Current Medications: Current Meds  Medication Sig  . buPROPion (WELLBUTRIN XL) 300 MG 24 hr tablet Take 1 tablet (300 mg total) by mouth daily. Please call to schedule appointment. Thank you, Dr Tobie Poet  . ondansetron (ZOFRAN) 4 MG tablet Take 1 tablet (4 mg total) by mouth every 8 (eight) hours as needed for nausea or vomiting.  Marland Kitchen VIIBRYD 40 MG TABS Take 1 tablet (40 mg total) by mouth daily.     Allergies:   Sulfa antibiotics and Sulfur   Social History   Socioeconomic History  . Marital status: Legally Separated    Spouse name: Not on file  . Number of children: 3  . Years of education: 89  . Highest  education level: Not on file  Occupational History  . Occupation: [employer]  Tobacco Use  . Smoking status: Current Every Day Smoker    Packs/day: 1.00    Types: Cigarettes, E-cigarettes    Last attempt to quit: 2019    Years since quitting: 2.7  . Smokeless tobacco: Never Used  Vaping Use  . Vaping Use: Never used  Substance and Sexual Activity  . Alcohol use: Not Currently  . Drug use: Never  . Sexual activity: Not on file  Other Topics Concern  . Not on file  Social History Narrative   ** Merged History Encounter **       Drinks a couple cups of coffee a day, also may have a soda or glass of tea a day   Social Determinants of Radio broadcast assistant Strain:   . Difficulty of Paying Living Expenses: Not on file  Food Insecurity:   . Worried About Charity fundraiser in the Last Year: Not on file  . Ran Out of Food in the Last Year: Not on file  Transportation Needs:   . Lack of Transportation (Medical): Not on file  . Lack of Transportation (Non-Medical): Not on file  Physical Activity:   . Days of Exercise per Week: Not on file  . Minutes of Exercise per Session: Not on file  Stress:   . Feeling of Stress : Not on file  Social Connections:   . Frequency of Communication with Friends and Family: Not on file  . Frequency of Social Gatherings with Friends and Family: Not on file  . Attends Religious Services: Not on file  . Active Member of Clubs or Organizations: Not on file  . Attends Archivist Meetings: Not on file  . Marital Status: Not on file     Family History: The patient's family history includes Breast cancer in her maternal grandmother. ROS:   Please see the history of present illness.    All 14 point review of systems negative except as described per history of present illness  EKGs/Labs/Other Studies Reviewed:      Recent Labs: 08/08/2019: ALT 8; BUN 10; Creatinine, Ser 0.84; Hemoglobin 14.3; Platelets 325; Potassium 4.2; Sodium 144;  TSH 1.300  Recent Lipid Panel No results found for: CHOL, TRIG, HDL, CHOLHDL, VLDL, LDLCALC, LDLDIRECT  Physical Exam:    VS:  BP 120/62 (BP Location: Left Arm, Patient Position: Sitting, Cuff Size: Normal)   Pulse 70   Ht 5\' 4"  (1.626 m)   Wt 117 lb (53.1 kg)   SpO2 99%   BMI 20.08 kg/m     Wt Readings from Last 3 Encounters:  03/15/20 117 lb (53.1 kg)  08/24/19 124 lb (56.2 kg)  08/08/19 125 lb (56.7 kg)     GEN:  Well nourished, well developed in no acute distress HEENT: Normal NECK: No JVD; No carotid bruits LYMPHATICS: No lymphadenopathy CARDIAC: RRR, soft systolic murmur grade 1/6 best heard left border of the sternum, no rubs, no gallops RESPIRATORY:  Clear to auscultation without rales, wheezing or rhonchi  ABDOMEN: Soft, non-tender, non-distended MUSCULOSKELETAL:  No edema; No deformity  SKIN: Warm and dry LOWER EXTREMITIES: no swelling NEUROLOGIC:  Alert and oriented x 3 PSYCHIATRIC:  Normal affect   ASSESSMENT:    1. Palpitation   2. Cardiac murmur   3. Sinus tachycardia    PLAN:    In order of problems listed above:  1. Palpitations.  Ask you to wear Zio patch for a week to see if you have any significant palpitations.  I will not initiate any medications until we have a better diagnosis.  She does have very soft heart murmur I will schedule her to have echocardiogram likely plan before. 2. Sinus tachycardia: Plan as outlined above. 3. Dyslipidemia I did have her cholesterol from 2019, in the future we will repeat fasting lipid profile.   Medication Adjustments/Labs and Tests Ordered: Current medicines are reviewed at length with the patient today.  Concerns regarding medicines are outlined above.  Orders Placed This Encounter  Procedures  . LONG TERM MONITOR (3-14 DAYS)  . ECHOCARDIOGRAM COMPLETE   Medication changes: No orders of the defined types were placed in this encounter.   Signed, Park Liter, MD, [facility] 03/15/2020 4:06 PM    [facility]

## 2020-03-27 ENCOUNTER — Telehealth: Payer: Self-pay | Admitting: Cardiology

## 2020-03-27 NOTE — Telephone Encounter (Signed)
Left message for patient to return call.

## 2020-03-27 NOTE — Telephone Encounter (Signed)
Called patient. She just took monitor off yesterday and is sending back to day. She did not have a episode while wearing monitor. Today she began having palpitations in her throat and felt short of breath while on the way to work. She does not know what her heart rate was during this time as she was driving. This is the reason they put the monitor on in the first place. She wanted to let us know. I advised her most likely will have to wait until we get monitor results back. Will consult with dod to be sure nothing else should be done in mean time.

## 2020-03-27 NOTE — Telephone Encounter (Signed)
Called patient back. Informed her of Dr. Joya Gaskins note. She is aware we will await results from monitor. She will let us know if things change or get worse. No further questions.

## 2020-03-27 NOTE — Telephone Encounter (Signed)
Her previous monitor showed no significant arrhythmia  I would not change anything awaiting for the results of her monitor  Perhaps Dr. Geraldo Pitter could watch for this online at the [facility] site when it is available

## 2020-03-27 NOTE — Telephone Encounter (Signed)
Pt came in stating she took her heart monitor off last night but is having the "same issues" she's been having and wanted to know what her next step might be.

## 2020-03-27 NOTE — Telephone Encounter (Signed)
Patient returning call.

## 2020-04-11 ENCOUNTER — Ambulatory Visit (INDEPENDENT_AMBULATORY_CARE_PROVIDER_SITE_OTHER): Payer: 59

## 2020-04-11 ENCOUNTER — Other Ambulatory Visit: Payer: Self-pay

## 2020-04-11 DIAGNOSIS — R011 Cardiac murmur, unspecified: Secondary | ICD-10-CM | POA: Diagnosis not present

## 2020-04-11 DIAGNOSIS — R002 Palpitations: Secondary | ICD-10-CM | POA: Diagnosis not present

## 2020-04-11 LAB — ECHOCARDIOGRAM COMPLETE
Area-P 1/2: 4.21 cm2
S' Lateral: 2 cm

## 2020-04-11 NOTE — Progress Notes (Signed)
Complete echocardiogram performed.  Jimmy Corbin Hott RDCS, RVT  

## 2020-05-14 ENCOUNTER — Other Ambulatory Visit: Payer: Self-pay

## 2020-05-14 DIAGNOSIS — G43009 Migraine without aura, not intractable, without status migrainosus: Secondary | ICD-10-CM | POA: Insufficient documentation

## 2020-05-14 DIAGNOSIS — F419 Anxiety disorder, unspecified: Secondary | ICD-10-CM | POA: Insufficient documentation

## 2020-05-14 DIAGNOSIS — F331 Major depressive disorder, recurrent, moderate: Secondary | ICD-10-CM | POA: Insufficient documentation

## 2020-05-16 ENCOUNTER — Other Ambulatory Visit: Payer: Self-pay

## 2020-05-16 ENCOUNTER — Ambulatory Visit (INDEPENDENT_AMBULATORY_CARE_PROVIDER_SITE_OTHER): Payer: 59 | Admitting: Cardiology

## 2020-05-16 ENCOUNTER — Encounter: Payer: Self-pay | Admitting: Cardiology

## 2020-05-16 VITALS — BP 120/62 | HR 82 | Ht 64.0 in | Wt 115.0 lb

## 2020-05-16 DIAGNOSIS — R002 Palpitations: Secondary | ICD-10-CM | POA: Diagnosis not present

## 2020-05-16 DIAGNOSIS — F419 Anxiety disorder, unspecified: Secondary | ICD-10-CM | POA: Diagnosis not present

## 2020-05-16 DIAGNOSIS — I471 Supraventricular tachycardia: Secondary | ICD-10-CM | POA: Diagnosis not present

## 2020-05-16 MED ORDER — METOPROLOL TARTRATE 25 MG PO TABS: 25.0000 mg | ORAL_TABLET | Freq: Two times a day (BID) | ORAL | 1 refills | Status: DC

## 2020-05-16 NOTE — Addendum Note (Signed)
Addended by: Senaida Ores on: 05/16/2020 04:10 PM   Modules accepted: Orders

## 2020-05-16 NOTE — Patient Instructions (Signed)
Medication Instructions:  Your physician has recommended you make the following change in your medication:  TAKE: Metoprolol tartrate 25 mg twice daily as needed for palpitations.   *If you need a refill on your cardiac medications before your next appointment, please call your pharmacy*   Lab Work: None. If you have labs (blood work) drawn today and your tests are completely normal, you will receive your results only by: Marland Kitchen MyChart Message (if you have MyChart) OR . A paper copy in the mail If you have any lab test that is abnormal or we need to change your treatment, we will call you to review the results.   Testing/Procedures: None   Follow-Up: At [facility], you and your health needs are our priority.  As part of our continuing mission to provide you with exceptional heart care, we have created designated Provider Care Teams.  These Care Teams include your primary Cardiologist (physician) and Advanced Practice Providers (APPs -  Physician Assistants and Nurse Practitioners) who all work together to provide you with the care you need, when you need it.  We recommend signing up for the patient portal called "MyChart".  Sign up information is provided on this After Visit Summary.  MyChart is used to connect with patients for Virtual Visits (Telemedicine).  Patients are able to view lab/test results, encounter notes, upcoming appointments, etc.  Non-urgent messages can be sent to your provider as well.   To learn more about what you can do with MyChart, go to [email or web address].    Your next appointment:   6 month(s)  The format for your next appointment:   In Person  Provider:   Jenne Campus, MD   Other Instructions

## 2020-05-16 NOTE — Progress Notes (Signed)
Cardiology Office Note:    Date:  05/16/2020   ID:  Regina Duran, DOB June 23, 1975, MRN [id number]  PCP:  Regina Lynn, NP  Cardiologist:  Regina Campus, MD    Referring MD: Regina Lynn, NP   Chief Complaint  Patient presents with  . Follow-up  I am still having some palpitations  History of Present Illness:    Regina Duran is a 44 y.o. female with history of migraines depression who was referred to [place] because of episode of palpitations.  She did wear monitor which show some narrow complex tachycardia rare episodes, asymptomatic.  She still reports to have some skipped beats and palpitations sometimes lasting for all day.  She did also have echocardiogram done which showed preserved left ventricle ejection fraction without any pathology.  Past Medical History:  Diagnosis Date  . Abnormal EKG 08/08/2019  . Anxiety   . Cardiac murmur 08/24/2019  . Cervical stenosis of spine 11/06/2017  . Depression   . Ex-smoker 08/24/2019  . Major depressive disorder, recurrent, moderate ([place])   . Migraine without aura, not intractable, without status migrainosus   . Moderate episode of recurrent major depressive disorder ([place]) 08/09/2019  . Nausea 08/08/2019  . Other fatigue 08/08/2019  . Ovarian mass, right 01/01/2018  . Palpitation 08/09/2019  . Palpitations 08/24/2019  . Prominent abdominal aortic pulsation 08/24/2019  . Sinus tachycardia 08/24/2019    Past Surgical History:  Procedure Laterality Date  . Oophroectomy Right 06/16/2017   Right ovary removal  . TUBAL LIGATION  2005  . TUBAL LIGATION Bilateral 06/17/2003    Current Medications: Current Meds  Medication Sig  . acyclovir (ZOVIRAX) 200 MG capsule Take 200 mg by mouth daily.  Marland Kitchen buPROPion (WELLBUTRIN XL) 300 MG 24 hr tablet Take 1 tablet (300 mg total) by mouth daily. Please call to schedule appointment. Thank you, Dr Tobie Poet  . VIIBRYD 40 MG TABS Take 1 tablet (40 mg total) by mouth daily.     Allergies:    Sulfa antibiotics and Sulfur   Social History   Socioeconomic History  . Marital status: Legally Separated    Spouse name: Not on file  . Number of children: 3  . Years of education: 58  . Highest education level: Not on file  Occupational History  . Occupation: [employer]  Tobacco Use  . Smoking status: Current Every Day Smoker    Packs/day: 1.00    Types: Cigarettes, E-cigarettes    Last attempt to quit: 2019    Years since quitting: 2.9  . Smokeless tobacco: Never Used  Vaping Use  . Vaping Use: Never used  Substance and Sexual Activity  . Alcohol use: Not Currently  . Drug use: Never  . Sexual activity: Not on file  Other Topics Concern  . Not on file  Social History Narrative   ** Merged History Encounter **       Drinks a couple cups of coffee a day, also may have a soda or glass of tea a day   Social Determinants of Radio broadcast assistant Strain:   . Difficulty of Paying Living Expenses: Not on file  Food Insecurity:   . Worried About Charity fundraiser in the Last Year: Not on file  . Ran Out of Food in the Last Year: Not on file  Transportation Needs:   . Lack of Transportation (Medical): Not on file  . Lack of Transportation (Non-Medical): Not on file  Physical Activity:   .  Days of Exercise per Week: Not on file  . Minutes of Exercise per Session: Not on file  Stress:   . Feeling of Stress : Not on file  Social Connections:   . Frequency of Communication with Friends and Family: Not on file  . Frequency of Social Gatherings with Friends and Family: Not on file  . Attends Religious Services: Not on file  . Active Member of Clubs or Organizations: Not on file  . Attends Archivist Meetings: Not on file  . Marital Status: Not on file     Family History: The patient's family history includes Breast cancer in her maternal grandmother. ROS:   Please see the history of present illness.    All 14 point review of systems negative except as  described per history of present illness  EKGs/Labs/Other Studies Reviewed:      Recent Labs: 08/08/2019: ALT 8; BUN 10; Creatinine, Ser 0.84; Hemoglobin 14.3; Platelets 325; Potassium 4.2; Sodium 144; TSH 1.300  Recent Lipid Panel No results found for: CHOL, TRIG, HDL, CHOLHDL, VLDL, LDLCALC, LDLDIRECT  Physical Exam:    VS:  BP 120/62 (BP Location: Left Arm, Patient Position: Sitting)   Pulse 82   Ht 5\' 4"  (1.626 m)   Wt 115 lb (52.2 kg)   SpO2 98%   BMI 19.74 kg/m     Wt Readings from Last 3 Encounters:  05/16/20 115 lb (52.2 kg)  03/15/20 117 lb (53.1 kg)  08/24/19 124 lb (56.2 kg)     GEN:  Well nourished, well developed in no acute distress HEENT: Normal NECK: No JVD; No carotid bruits LYMPHATICS: No lymphadenopathy CARDIAC: RRR, no murmurs, no rubs, no gallops RESPIRATORY:  Clear to auscultation without rales, wheezing or rhonchi  ABDOMEN: Soft, non-tender, non-distended MUSCULOSKELETAL:  No edema; No deformity  SKIN: Warm and dry LOWER EXTREMITIES: no swelling NEUROLOGIC:  Alert and oriented x 3 PSYCHIATRIC:  Normal affect   ASSESSMENT:    1. Palpitations   2. Supraventricular tachycardia ([place])   3. Anxiety    PLAN:    In order of problems listed above:  1. Supraventricular tachycardia with palpitations.  I will ask her to start taking small dose of beta-blocker.  We did talk about different ways that this medication can be taken.  She is she does have relatively rare episodes it may be taken on as-needed basis.  Will initiate 25 metoprolol tartrate twice daily. 2. Anxiety: Does follow-up by antimedicine team.  Beta-blocker can help with this problem as well. 3. Palpitations: Plan as outlined above   Medication Adjustments/Labs and Tests Ordered: Current medicines are reviewed at length with the patient today.  Concerns regarding medicines are outlined above.  No orders of the defined types were placed in this encounter.  Medication changes: No orders  of the defined types were placed in this encounter.   Signed, Park Liter, MD, [facility] 05/16/2020 3:58 PM    [place]

## 2020-05-30 ENCOUNTER — Other Ambulatory Visit: Payer: Self-pay | Admitting: Cardiology

## 2020-06-13 ENCOUNTER — Encounter: Payer: Self-pay | Admitting: Nurse Practitioner

## 2020-06-13 ENCOUNTER — Other Ambulatory Visit: Payer: Self-pay

## 2020-06-13 ENCOUNTER — Ambulatory Visit (INDEPENDENT_AMBULATORY_CARE_PROVIDER_SITE_OTHER): Payer: 59 | Admitting: Nurse Practitioner

## 2020-06-13 VITALS — BP 118/72 | HR 68 | Temp 96.5°F | Ht 64.0 in | Wt 116.0 lb

## 2020-06-13 DIAGNOSIS — Z1231 Encounter for screening mammogram for malignant neoplasm of breast: Secondary | ICD-10-CM | POA: Diagnosis not present

## 2020-06-13 DIAGNOSIS — Z0000 Encounter for general adult medical examination without abnormal findings: Secondary | ICD-10-CM | POA: Diagnosis not present

## 2020-06-13 NOTE — Patient Instructions (Addendum)
We will call you with lab results and mammogram appointment Follow-up in 1-year or sooner if needed Recommend decrease vaping  Electronic Cigarette Information  Electronic cigarettes, or e-cigarettes, are battery-operated devices that deliver nicotine--a very addictive drug--to the body. They come in many shapes, including in the shape of a cigarette, pipe, pen, and even a USB memory stick. E-cigarettes have a cartridge that contains a liquid form of nicotine. When a person uses the device, the liquid heats up. It then becomes a vapor. Inhaling this vapor is called vaping. Nicotine is thought to increase your risk for certain types of cancer. In addition to nicotine, e-cigarettes may contain other harmful and cancer-causing chemicals, including: Formaldehyde. Acetaldehyde. Heavy metals. Ultra-fine particles that can get inhaled deep into the lungs. Chemical colorings and flavorings. It is not clear how much nicotine you get when vaping, and it is hard to know what chemicals are in the vaping liquids. The health effects of vaping are not completely known, but you should be aware of the possible dangers of using these products. Some people may use e-cigarettes in order to quit smoking tobacco. However, this has not been proven to work, and the Transport planner (FDA) has not approved e-cigarettes for this purpose. How can using electronic cigarettes affect me? You may be at risk for developing a dangerous lung disease. There are reports of an increasing number of cases involving serious lung problems, and even death, associated with e-cigarette use. Your risk may be even higher if you: Buy e-cigarettes or vaping oils off the street. Add any substances to the e-cigarettes that are not intended by the manufacturer. Vaping may make you crave nicotine. Nicotine does the following: Changes your blood sugar levels. Increases your heart rate, blood pressure, and breathing rate. Increases your  risk of developing blood clots (hypercoagulable state) and diabetes. Increases your risk of gum disease that may lead to losing teeth. If you smoke e-cigarettes, you may be more likely to start smoking or to smoke more tobacco cigarettes. Becoming addicted to nicotine may make your brain more sensitive to other addictive drugs. You may move to other addictive substances. You may be in danger of overdosing on nicotine. Nicotine poisoning can cause nausea, vomiting, seizures, and trouble breathing. An e-cigarette may explode and cause fires and burn injuries. If you are pregnant, the nicotine in e-cigarettes may be harmful to your baby. Nicotine can cause: Brain or lung problems for your baby. Your baby to be born too early. Your baby to be born with a low birth weight. Vaping has also been linked to decreases in memory and attention span in children and teens. What actions can I take to stop vaping? If you can, stop vaping on your own before you become addicted to nicotine. If you need help quitting, ask your health care provider. There are three effective ways to fight nicotine addiction: Nicotine replacement therapy. Using nicotine gum or a nicotine patch blocks your craving for nicotine. Over time, you can reduce the amount of nicotine you use until you can stop using nicotine completely without having cravings. Prescription medicines approved to fight nicotine addiction. These stop nicotine cravings or block the effects of nicotine. Behavioral therapy. This may include: A self-help smoking cessation program. Individual or group therapy. A smoking cessation support group. There are several national programs to help you quit smoking or vaping. These include: Text message programs, such as SmokefreeTXT. Apps for mobile phones, including the free quitSTART app. Hotlines, such as 1-800-QUIT-NOW (  6042706633). Where to find support You can get support at these sites: U.S. Department of  Health and Human Services: https://smokefree.gov American Lung Association: www.lung.org Where to find more information Learn more about e-cigarettes from: [employer] on Drug Abuse: motorcyclefax.com Centers for Disease Control and Prevention: http://www.wolf.info/ Summary E-cigarettes can cause nicotine addiction. E-cigarettes are not approved as a way to stop smoking. They are not a risk-free alternative to smoking tobacco. There are reports of an increasing number of cases involving serious lung problems, and even death, associated with e-cigarette use. If you can stop vaping on your own, do it before you become addicted to nicotine. If you need help quitting, ask your health care provider. There are various methods and programs that can help you stop smoking or vaping. This information is not intended to replace advice given to you by your health care provider. Make sure you discuss any questions you have with your health care provider. Document Revised: 09/28/2018 Document Reviewed: 08/13/2018 Elsevier Patient Education  2020 Elsevier Inc. Preventive Care 71-78 Years Old, Female Preventive care refers to visits with your health care provider and lifestyle choices that can promote health and wellness. This includes:  A yearly physical exam. This may also be called an annual well check.  Regular dental visits and eye exams.  Immunizations.  Screening for certain conditions.  Healthy lifestyle choices, such as eating a healthy diet, getting regular exercise, not using drugs or products that contain nicotine and tobacco, and limiting alcohol use. What can I expect for my preventive care visit? Physical exam Your health care provider will check your:  Height and weight. This may be used to calculate body mass index (BMI), which tells if you are at a healthy weight.  Heart rate and blood pressure.  Skin for abnormal spots. Counseling Your health care provider may ask you questions  about your:  Alcohol, tobacco, and drug use.  Emotional well-being.  Home and relationship well-being.  Sexual activity.  Eating habits.  Work and work Statistician.  Method of birth control.  Menstrual cycle.  Pregnancy history. What immunizations do I need?  Influenza (flu) vaccine  This is recommended every year. Tetanus, diphtheria, and pertussis (Tdap) vaccine  You may need a Td booster every 10 years. Varicella (chickenpox) vaccine  You may need this if you have not been vaccinated. Zoster (shingles) vaccine  You may need this after age 84. Measles, mumps, and rubella (MMR) vaccine  You may need at least one dose of MMR if you were born in 1957 or later. You may also need a second dose. Pneumococcal conjugate (PCV13) vaccine  You may need this if you have certain conditions and were not previously vaccinated. Pneumococcal polysaccharide (PPSV23) vaccine  You may need one or two doses if you smoke cigarettes or if you have certain conditions. Meningococcal conjugate (MenACWY) vaccine  You may need this if you have certain conditions. Hepatitis A vaccine  You may need this if you have certain conditions or if you travel or work in places where you may be exposed to hepatitis A. Hepatitis B vaccine  You may need this if you have certain conditions or if you travel or work in places where you may be exposed to hepatitis B. Haemophilus influenzae type b (Hib) vaccine  You may need this if you have certain conditions. Human papillomavirus (HPV) vaccine  If recommended by your health care provider, you may need three doses over 6 months. You may receive vaccines as individual doses  or as more than one vaccine together in one shot (combination vaccines). Talk with your health care provider about the risks and benefits of combination vaccines. What tests do I need? Blood tests  Lipid and cholesterol levels. These may be checked every 5 years, or more  frequently if you are over 23 years old.  Hepatitis C test.  Hepatitis B test. Screening  Lung cancer screening. You may have this screening every year starting at age 70 if you have a 30-pack-year history of smoking and currently smoke or have quit within the past 15 years.  Colorectal cancer screening. All adults should have this screening starting at age 37 and continuing until age 7. Your health care provider may recommend screening at age 88 if you are at increased risk. You will have tests every 1-10 years, depending on your results and the type of screening test.  Diabetes screening. This is done by checking your blood sugar (glucose) after you have not eaten for a while (fasting). You may have this done every 1-3 years.  Mammogram. This may be done every 1-2 years. Talk with your health care provider about when you should start having regular mammograms. This may depend on whether you have a family history of breast cancer.  BRCA-related cancer screening. This may be done if you have a family history of breast, ovarian, tubal, or peritoneal cancers.  Pelvic exam and Pap test. This may be done every 3 years starting at age 41. Starting at age 40, this may be done every 5 years if you have a Pap test in combination with an HPV test. Other tests  Sexually transmitted disease (STD) testing.  Bone density scan. This is done to screen for osteoporosis. You may have this scan if you are at high risk for osteoporosis. Follow these instructions at home: Eating and drinking  Eat a diet that includes fresh fruits and vegetables, whole grains, lean protein, and low-fat dairy.  Take vitamin and mineral supplements as recommended by your health care provider.  Do not drink alcohol if: ? Your health care provider tells you not to drink. ? You are pregnant, may be pregnant, or are planning to become pregnant.  If you drink alcohol: ? Limit how much you have to 0-1 drink a day. ? Be aware  of how much alcohol is in your drink. In the [place] one drink equals one 12 oz bottle of beer (355 mL), one 5 oz glass of wine (148 mL), or one 1 oz glass of hard liquor (44 mL). Lifestyle  Take daily care of your teeth and gums.  Stay active. Exercise for at least 30 minutes on 5 or more days each week.  Do not use any products that contain nicotine or tobacco, such as cigarettes, e-cigarettes, and chewing tobacco. If you need help quitting, ask your health care provider.  If you are sexually active, practice safe sex. Use a condom or other form of birth control (contraception) in order to prevent pregnancy and STIs (sexually transmitted infections).  If told by your health care provider, take low-dose aspirin daily starting at age 36. What's next?  Visit your health care provider once a year for a well check visit.  Ask your health care provider how often you should have your eyes and teeth checked.  Stay up to date on all vaccines. This information is not intended to replace advice given to you by your health care provider. Make sure you discuss any questions you have with  your health care provider. Document Revised: 02/11/2018 Document Reviewed: 02/11/2018 Elsevier Patient Education  [place] Maintenance, Female Adopting a healthy lifestyle and getting preventive care are important in promoting health and wellness. Ask your health care provider about:  The right schedule for you to have regular tests and exams.  Things you can do on your own to prevent diseases and keep yourself healthy. What should I know about diet, weight, and exercise? Eat a healthy diet   Eat a diet that includes plenty of vegetables, fruits, low-fat dairy products, and lean protein.  Do not eat a lot of foods that are high in solid fats, added sugars, or sodium. Maintain a healthy weight Body mass index (BMI) is used to identify weight problems. It estimates body fat based on height and  weight. Your health care provider can help determine your BMI and help you achieve or maintain a healthy weight. Get regular exercise Get regular exercise. This is one of the most important things you can do for your health. Most adults should:  Exercise for at least 150 minutes each week. The exercise should increase your heart rate and make you sweat (moderate-intensity exercise).  Do strengthening exercises at least twice a week. This is in addition to the moderate-intensity exercise.  Spend less time sitting. Even light physical activity can be beneficial. Watch cholesterol and blood lipids Have your blood tested for lipids and cholesterol at 46 years of age, then have this test every 5 years. Have your cholesterol levels checked more often if:  Your lipid or cholesterol levels are high.  You are older than 46 years of age.  You are at high risk for heart disease. What should I know about cancer screening? Depending on your health history and family history, you may need to have cancer screening at various ages. This may include screening for:  Breast cancer.  Cervical cancer.  Colorectal cancer.  Skin cancer.  Lung cancer. What should I know about heart disease, diabetes, and high blood pressure? Blood pressure and heart disease  High blood pressure causes heart disease and increases the risk of stroke. This is more likely to develop in people who have high blood pressure readings, are of African descent, or are overweight.  Have your blood pressure checked: ? Every 3-5 years if you are 79-60 years of age. ? Every year if you are 39 years old or older. Diabetes Have regular diabetes screenings. This checks your fasting blood sugar level. Have the screening done:  Once every three years after age 59 if you are at a normal weight and have a low risk for diabetes.  More often and at a younger age if you are overweight or have a high risk for diabetes. What should I know  about preventing infection? Hepatitis B If you have a higher risk for hepatitis B, you should be screened for this virus. Talk with your health care provider to find out if you are at risk for hepatitis B infection. Hepatitis C Testing is recommended for:  Everyone born from [birth year] through 1965.  Anyone with known risk factors for hepatitis C. Sexually transmitted infections (STIs)  Get screened for STIs, including gonorrhea and chlamydia, if: ? You are sexually active and are younger than 46 years of age. ? You are older than 46 years of age and your health care provider tells you that you are at risk for this type of infection. ? Your sexual activity has changed since you were  last screened, and you are at increased risk for chlamydia or gonorrhea. Ask your health care provider if you are at risk.  Ask your health care provider about whether you are at high risk for HIV. Your health care provider may recommend a prescription medicine to help prevent HIV infection. If you choose to take medicine to prevent HIV, you should first get tested for HIV. You should then be tested every 3 months for as long as you are taking the medicine. Pregnancy  If you are about to stop having your period (premenopausal) and you may become pregnant, seek counseling before you get pregnant.  Take 400 to 800 micrograms (mcg) of folic acid every day if you become pregnant.  Ask for birth control (contraception) if you want to prevent pregnancy. Osteoporosis and menopause Osteoporosis is a disease in which the bones lose minerals and strength with aging. This can result in bone fractures. If you are 55 years old or older, or if you are at risk for osteoporosis and fractures, ask your health care provider if you should:  Be screened for bone loss.  Take a calcium or vitamin D supplement to lower your risk of fractures.  Be given hormone replacement therapy (HRT) to treat symptoms of menopause. Follow these  instructions at home: Lifestyle  Do not use any products that contain nicotine or tobacco, such as cigarettes, e-cigarettes, and chewing tobacco. If you need help quitting, ask your health care provider.  Do not use street drugs.  Do not share needles.  Ask your health care provider for help if you need support or information about quitting drugs. Alcohol use  Do not drink alcohol if: ? Your health care provider tells you not to drink. ? You are pregnant, may be pregnant, or are planning to become pregnant.  If you drink alcohol: ? Limit how much you use to 0-1 drink a day. ? Limit intake if you are breastfeeding.  Be aware of how much alcohol is in your drink. In the [place] one drink equals one 12 oz bottle of beer (355 mL), one 5 oz glass of wine (148 mL), or one 1 oz glass of hard liquor (44 mL). General instructions  Schedule regular health, dental, and eye exams.  Stay current with your vaccines.  Tell your health care provider if: ? You often feel depressed. ? You have ever been abused or do not feel safe at home. Summary  Adopting a healthy lifestyle and getting preventive care are important in promoting health and wellness.  Follow your health care provider's instructions about healthy diet, exercising, and getting tested or screened for diseases.  Follow your health care provider's instructions on monitoring your cholesterol and blood pressure. This information is not intended to replace advice given to you by your health care provider. Make sure you discuss any questions you have with your health care provider. Document Revised: 05/26/2018 Document Reviewed: 05/26/2018 Elsevier Patient Education  2020 [employer].

## 2020-06-13 NOTE — Progress Notes (Signed)
Subjective:  Patient ID: Regina Duran, female    DOB: [birth year]  Age: 44 y.o. MRN: [id number]  Chief Complaint  Patient presents with  . Annual Exam    HPI Well Adult Physical: Patient here for a comprehensive physical exam.The patient reports no problems Do you take any herbs or supplements that were not prescribed by a doctor? no Are you taking calcium supplements? no Are you taking aspirin daily? no  Encounter for general adult medical examination without abnormal findings  Physical ("At Risk" items are starred): Patient's last physical exam was 1 year ago .  Smoking: Former smoker, quit 2019, currently vaping ;  Physical Activity: Not currently exercising ;  Alcohol/Drug Use: Is a non-drinker ; No illicit drug use ;  Patient is not afflicted from Stress Incontinence and Urge Incontinence  Safety: reviewed. Patient wears a seat belt, has smoke detectors, has carbon monoxide detectors, practices appropriate gun safety, and wears sunscreen with extended sun exposure. Dental Care: biannual cleanings, brushes and flosses daily. Ophthalmology/Optometry: Annual visit.  Hearing loss: none Vision impairments: none  Menarche: 13 Menstrual History: regular menses Pregnancy history: 3 pregnancies Safe at home:yes Self breast exams: no   Mammogram 2018- normal             Social Hx   Social History   Socioeconomic History  . Marital status: Legally Separated    Spouse name: Not on file  . Number of children: 3  . Years of education: 70  . Highest education level: Not on file  Occupational History  . Occupation: [employer]  Tobacco Use  . Smoking status: Current Every Day Smoker    Packs/day: 1.00    Types: Cigarettes, E-cigarettes    Last attempt to quit: 2019    Years since quitting: 2.9  . Smokeless tobacco: Never Used  Vaping Use  . Vaping Use: Never used  Substance and Sexual Activity  . Alcohol use: Not Currently  . Drug use: Never  . Sexual activity:  Not on file  Other Topics Concern  . Not on file  Social History Narrative   ** Merged History Encounter **       Drinks a couple cups of coffee a day, also may have a soda or glass of tea a day   Social Determinants of Corporate investment banker Strain: Not on file  Food Insecurity: Not on file  Transportation Needs: Not on file  Physical Activity: Not on file  Stress: Not on file  Social Connections: Not on file   Past Medical History:  Diagnosis Date  . Abnormal EKG 08/08/2019  . Anxiety   . Cardiac murmur 08/24/2019  . Cervical stenosis of spine 11/06/2017  . Depression   . Ex-smoker 08/24/2019  . Major depressive disorder, recurrent, moderate (HCC)   . Migraine without aura, not intractable, without status migrainosus   . Moderate episode of recurrent major depressive disorder (HCC) 08/09/2019  . Nausea 08/08/2019  . Other fatigue 08/08/2019  . Ovarian mass, right 01/01/2018  . Palpitation 08/09/2019  . Palpitations 08/24/2019  . Prominent abdominal aortic pulsation 08/24/2019  . Sinus tachycardia 08/24/2019   Past Surgical History:  Procedure Laterality Date  . Oophroectomy Right 06/16/2017   Right ovary removal  . TUBAL LIGATION  2005  . TUBAL LIGATION Bilateral 06/17/2003    Family History  Problem Relation Age of Onset  . Breast cancer Maternal Grandmother     Review of Systems  Constitutional: Negative for fatigue and  fever.  HENT: Negative for congestion, ear pain, sinus pressure and sore throat.   Eyes: Negative for pain.  Respiratory: Negative for cough, chest tightness, shortness of breath and wheezing.   Cardiovascular: Negative for chest pain and palpitations.  Gastrointestinal: Negative for abdominal pain, constipation, diarrhea, nausea and vomiting.  Genitourinary: Negative for dysuria and hematuria.  Musculoskeletal: Negative for arthralgias, back pain, joint swelling and myalgias.  Skin: Negative for rash.  Neurological: Negative for dizziness,  weakness and headaches.  Psychiatric/Behavioral: Negative for dysphoric mood. The patient is not nervous/anxious.      Objective:  BP 118/72 (BP Location: Left Arm, Patient Position: Sitting)   Pulse 68   Temp (!) 96.5 F (35.8 C) (Temporal)   Ht 5\' 4"  (1.626 m)   Wt 116 lb (52.6 kg)   SpO2 97%   BMI 19.91 kg/m   BP/Weight 06/13/2020 05/16/2020 [id number]  Systolic BP 123456 123456 [id number]  Diastolic BP 72 62 62  Wt. (Lbs) 116 115 117  BMI 19.91 19.74 20.08    Physical Exam Constitutional:      Appearance: Normal appearance.  HENT:     Right Ear: Tympanic membrane, ear canal and external ear normal.     Left Ear: Tympanic membrane, ear canal and external ear normal.     Nose: Nose normal.     Mouth/Throat:     Mouth: Mucous membranes are moist.  Cardiovascular:     Rate and Rhythm: Normal rate and regular rhythm.     Pulses: Normal pulses.     Heart sounds: Normal heart sounds.  Pulmonary:     Effort: Pulmonary effort is normal.     Breath sounds: Normal breath sounds.  Abdominal:     Palpations: Abdomen is soft.  Musculoskeletal:        General: Normal range of motion.     Cervical back: Normal range of motion.  Skin:    General: Skin is warm and dry.  Neurological:     Mental Status: She is oriented to person, place, and time.  Psychiatric:        Mood and Affect: Mood normal.        Behavior: Behavior normal.        Thought Content: Thought content normal.        Judgment: Judgment normal.     Lab Results  Component Value Date   WBC 8.8 08/08/2019   HGB 14.3 08/08/2019   HCT 40.8 08/08/2019   PLT 325 08/08/2019   GLUCOSE 92 08/08/2019   ALT 8 08/08/2019   AST 19 08/08/2019   NA 144 08/08/2019   K 4.2 08/08/2019   CL 103 08/08/2019   CREATININE 0.84 08/08/2019   BUN 10 08/08/2019   CO2 24 08/08/2019   TSH 1.300 08/08/2019      Assessment & Plan:  1. Encounter for general adult medical examination without abnormal findings  2. Encounter for  screening mammogram for malignant neoplasm of breast    Body mass index is 19.91 kg/m.   These are the goals we discussed: Stop vaping   This is a list of the screening recommended for you and due dates:  Health Maintenance  Topic Date Due  .  Hepatitis C: One time screening is recommended by Center for Disease Control  (CDC) for  adults born from [birth year] through 1965.   Never done  . HIV Screening  Never done  . Colon Cancer Screening  Never done  . Pap Smear  04/21/2021  .  Tetanus Vaccine  03/17/2027  . Flu Shot  Completed  . COVID-19 Vaccine  Completed     AN INDIVIDUALIZED CARE PLAN: was established or reinforced today.   SELF MANAGEMENT: The patient and I together assessed ways to personally work towards obtaining the recommended goals  Support needs The patient and/or family needs were assessed and services were offered if appropriate.    We will call you with lab results and mammogram appointment Follow-up in 1-year or sooner if needed Recommend decrease vaping  Follow-up: 1-year or sooner if needed  An After Visit Summary was printed and given to the patient.  Rip Harbour, NP [place] [phone]

## 2020-06-14 ENCOUNTER — Other Ambulatory Visit: Payer: Self-pay | Admitting: Family Medicine

## 2020-06-14 LAB — CBC WITH DIFFERENTIAL/PLATELET
Basophils Absolute: 0 10*3/uL (ref 0.0–0.2)
Basos: 1 %
EOS (ABSOLUTE): 0.1 10*3/uL (ref 0.0–0.4)
Eos: 1 %
Hematocrit: 35.4 % (ref 34.0–46.6)
Hemoglobin: 11.9 g/dL (ref 11.1–15.9)
Immature Grans (Abs): 0 10*3/uL (ref 0.0–0.1)
Immature Granulocytes: 0 %
Lymphocytes Absolute: 2.1 10*3/uL (ref 0.7–3.1)
Lymphs: 31 %
MCH: 30.9 pg (ref 26.6–33.0)
MCHC: 33.6 g/dL (ref 31.5–35.7)
MCV: 92 fL (ref 79–97)
Monocytes Absolute: 0.6 10*3/uL (ref 0.1–0.9)
Monocytes: 9 %
Neutrophils Absolute: 3.8 10*3/uL (ref 1.4–7.0)
Neutrophils: 58 %
Platelets: 276 10*3/uL (ref 150–450)
RBC: 3.85 x10E6/uL (ref 3.77–5.28)
RDW: 12.4 % (ref 11.7–15.4)
WBC: 6.6 10*3/uL (ref 3.4–10.8)

## 2020-06-14 LAB — COMPREHENSIVE METABOLIC PANEL
ALT: 8 IU/L (ref 0–32)
AST: 18 IU/L (ref 0–40)
Albumin/Globulin Ratio: 2 (ref 1.2–2.2)
Albumin: 4.4 g/dL (ref 3.8–4.8)
Alkaline Phosphatase: 58 IU/L (ref 44–121)
BUN/Creatinine Ratio: 14 (ref 9–23)
BUN: 11 mg/dL (ref 6–24)
Bilirubin Total: 0.2 mg/dL (ref 0.0–1.2)
CO2: 26 mmol/L (ref 20–29)
Calcium: 9.2 mg/dL (ref 8.7–10.2)
Chloride: 103 mmol/L (ref 96–106)
Creatinine, Ser: 0.77 mg/dL (ref 0.57–1.00)
GFR calc Af Amer: 107 mL/min/{1.73_m2} (ref >59)
GFR calc non Af Amer: 93 mL/min/{1.73_m2} (ref >59)
Globulin, Total: 2.2 g/dL (ref 1.5–4.5)
Glucose: 74 mg/dL (ref 65–99)
Potassium: 4.2 mmol/L (ref 3.5–5.2)
Sodium: 142 mmol/L (ref 134–144)
Total Protein: 6.6 g/dL (ref 6.0–8.5)

## 2020-06-14 LAB — TSH: TSH: 1.6 u[IU]/mL (ref 0.450–4.500)

## 2020-07-26 ENCOUNTER — Other Ambulatory Visit: Payer: Self-pay

## 2020-07-26 ENCOUNTER — Encounter: Payer: Self-pay | Admitting: Nurse Practitioner

## 2020-07-26 ENCOUNTER — Ambulatory Visit: Payer: 59 | Admitting: Nurse Practitioner

## 2020-07-26 VITALS — BP 118/68 | HR 74 | Temp 97.5°F | Ht 64.0 in | Wt 112.0 lb

## 2020-07-26 DIAGNOSIS — R5381 Other malaise: Secondary | ICD-10-CM

## 2020-07-26 DIAGNOSIS — R5383 Other fatigue: Secondary | ICD-10-CM | POA: Diagnosis not present

## 2020-07-26 DIAGNOSIS — R630 Anorexia: Secondary | ICD-10-CM

## 2020-07-26 DIAGNOSIS — G4710 Hypersomnia, unspecified: Secondary | ICD-10-CM | POA: Diagnosis not present

## 2020-07-26 DIAGNOSIS — Z720 Tobacco use: Secondary | ICD-10-CM | POA: Diagnosis not present

## 2020-07-26 DIAGNOSIS — Z681 Body mass index (BMI) 19 or less, adult: Secondary | ICD-10-CM

## 2020-07-26 LAB — POC COVID19 BINAXNOW: SARS Coronavirus 2 Ag: NEGATIVE

## 2020-07-26 NOTE — Patient Instructions (Addendum)
We will call you with lab results Continue medications as prescribed Follow up 91-month or sooner if needed Hypersomnia Hypersomnia is a condition in which a person feels very tired during the day even though he or she gets plenty of sleep at night. A person with this condition may take naps during the day and may find it very difficult to wake up from sleep. Hypersomnia may affect a person's ability to think, concentrate, drive, or remember things. What are the causes? The cause of this condition may not be known. Possible causes include:  Certain medicines.  Sleep disorders, such as narcolepsy and sleep apnea.  Injury to the head, brain, or spinal cord.  Drug or alcohol use.  Gastroesophageal reflux disease (GERD).  Tumors.  Certain medical conditions, such as depression, diabetes, or an underactive thyroid gland (hypothyroidism). What are the signs or symptoms? The main symptoms of hypersomnia include:  Feeling very tired throughout the day, regardless of how much sleep you got the night before.  Having trouble waking up. Others may find it difficult to wake you up when you are sleeping.  Sleeping for longer and longer periods at a time.  Taking naps throughout the day. Other symptoms may include:  Feeling restless, anxious, or annoyed.  Lacking energy.  Having trouble with: ? Remembering. ? Speaking. ? Thinking.  Loss of appetite.  Seeing, hearing, tasting, smelling, or feeling things that are not real (hallucinations). How is this diagnosed? This condition may be diagnosed based on:  Your symptoms and medical history.  Your sleeping habits. Your health care provider may ask you to write down your sleeping habits in a daily sleep log, along with any symptoms you have.  A series of tests that are done while you sleep (sleep study or polysomnogram).  A test that measures how quickly you can fall asleep during the day (daytime nap study or multiple sleep latency  test). How is this treated? Treatment can help you manage your condition. Treatment may include:  Following a regular sleep routine.  Lifestyle changes, such as changing your eating habits, getting regular exercise, and avoiding alcohol or caffeinated beverages.  Taking medicines to make you more alert (stimulants) during the day.  Treating any underlying medical causes of hypersomnia. Follow these instructions at home: Sleep routine  Schedule the same bedtime and wake-up time each day.  Practice a relaxing bedtime routine. This may include reading, meditation, deep breathing, or taking a warm bath before going to sleep.  Get regular exercise each day. Avoid strenuous exercise in the evening hours.  Keep your sleep environment at a cooler temperature, darkened, and quiet.  Sleep with pillows and a mattress that are comfortable and supportive.  Schedule short 20-minute naps for when you feel sleepiest during the day.  Talk with your employer or teachers about your hypersomnia. If possible, adjust your schedule so that: ? You have a regular daytime work schedule. ? You can take a scheduled nap during the day. ? You do not have to work or be active at night.  Do not eat a heavy meal for a few hours before bedtime. Eat your meals at about the same times every day.  Avoid drinking alcohol or caffeinated beverages.   Safety  Do not drive or use heavy machinery if you are sleepy. Ask your health care provider if it is safe for you to drive.  Wear a life jacket when swimming or spending time near water.   General instructions  Take supplements and  over-the-counter and prescription medicines only as told by your health care provider.  Keep a sleep log that will help your doctor manage your condition. This may include information about: ? What time you go to bed each night. ? How often you wake up at night. ? How many hours you sleep at night. ? How often and for how long you nap  during the day. ? Any observations from others, such as leg movements during sleep, sleep walking, or snoring.  Keep all follow-up visits as told by your health care provider. This is important. Contact a health care provider if:  You have new symptoms.  Your symptoms get worse. Get help right away if:  You have serious thoughts about hurting yourself or someone else. If you ever feel like you may hurt yourself or others, or have thoughts about taking your own life, get help right away. You can go to your nearest emergency department or call:  Your local emergency services (911 in the [place]  A suicide crisis helpline, such as the [place] at (813)579-7105. This is open 24 hours a day. Summary  Hypersomnia refers to a condition in which you feel very tired during the day even though you get plenty of sleep at night.  A person with this condition may take naps during the day and may find it very difficult to wake up from sleep.  Hypersomnia may affect a person's ability to think, concentrate, drive, or remember things.  Treatment, such as following a regular sleep routine and making some lifestyle changes, can help you manage your condition. This information is not intended to replace advice given to you by your health care provider. Make sure you discuss any questions you have with your health care provider. Document Revised: 04/12/2020 Document Reviewed: 04/12/2020 Elsevier Patient Education  2021 [place].   Fatigue If you have fatigue, you feel tired all the time and have a lack of energy or a lack of motivation. Fatigue may make it difficult to start or complete tasks because of exhaustion. In general, occasional or mild fatigue is often a normal response to activity or life. However, long-lasting (chronic) or extreme fatigue may be a symptom of a medical condition. Follow these instructions at home: General instructions  Watch your fatigue  for any changes.  Go to bed and get up at the same time every day.  Avoid fatigue by pacing yourself during the day and getting enough sleep at night.  Maintain a healthy weight. Medicines  Take over-the-counter and prescription medicines only as told by your health care provider.  Take a multivitamin, if told by your health care provider.  Do not use herbal or dietary supplements unless they are approved by your health care provider. Activity  Exercise regularly, as told by your health care provider.  Use or practice techniques to help you relax, such as yoga, tai chi, meditation, or massage therapy.   Eating and drinking  Avoid heavy meals in the evening.  Eat a well-balanced diet, which includes lean proteins, whole grains, plenty of fruits and vegetables, and low-fat dairy products.  Avoid consuming too much caffeine.  Avoid the use of alcohol.  Drink enough fluid to keep your urine pale yellow.   Lifestyle  Change situations that cause you stress. Try to keep your work and personal schedule in balance.  Do not use any products that contain nicotine or tobacco, such as cigarettes and e-cigarettes. If you need help quitting, ask your  health care provider.  Do not use drugs. Contact a health care provider if:  Your fatigue does not get better.  You have a fever.  You suddenly lose or gain weight.  You have headaches.  You have trouble falling asleep or sleeping through the night.  You feel angry, guilty, anxious, or sad.  You are unable to have a bowel movement (constipation).  Your skin is dry.  You have swelling in your legs or another part of your body. Get help right away if:  You feel confused.  Your vision is blurry.  You feel faint or you pass out.  You have a severe headache.  You have severe pain in your abdomen, your back, or the area between your waist and hips (pelvis).  You have chest pain, shortness of breath, or an irregular or  fast heartbeat.  You are unable to urinate, or you urinate less than normal.  You have abnormal bleeding, such as bleeding from the rectum, vagina, nose, lungs, or nipples.  You vomit blood.  You have thoughts about hurting yourself or others. If you ever feel like you may hurt yourself or others, or have thoughts about taking your own life, get help right away. You can go to your nearest emergency department or call:  Your local emergency services (911 in the [place]  A suicide crisis helpline, such as the [place] at 914-848-6462. This is open 24 hours a day. Summary  If you have fatigue, you feel tired all the time and have a lack of energy or a lack of motivation.  Fatigue may make it difficult to start or complete tasks because of exhaustion.  Long-lasting (chronic) or extreme fatigue may be a symptom of a medical condition.  Exercise regularly, as told by your health care provider.  Change situations that cause you stress. Try to keep your work and personal schedule in balance. This information is not intended to replace advice given to you by your health care provider. Make sure you discuss any questions you have with your health care provider. Document Revised: 12/22/2018 Document Reviewed: 02/25/2017 Elsevier Patient Education  2021 [place] protect organs, store calcium, anchor muscles, and support the whole body. Keeping your bones strong is important, especially as you get older. You can take actions to help keep your bones strong and healthy. Why is keeping my bones healthy important? Keeping your bones healthy is important because your body constantly replaces bone cells. Cells get old, and new cells take their place. As we age, we lose bone cells because the body may not be able to make enough new cells to replace the old cells. The amount of bone cells and bone tissue you have is referred to as bone mass. The higher  your bone mass, the stronger your bones. The aging process leads to an overall loss of bone mass in the body, which can increase the likelihood of:  Joint pain and stiffness.  Broken bones.  A condition in which the bones become weak and brittle (osteoporosis). A large decline in bone mass occurs in older adults. In women, it occurs about the time of menopause.   What actions can I take to keep my bones healthy? Good health habits are important for maintaining healthy bones. This includes eating nutritious foods and exercising regularly. To have healthy bones, you need to get enough of the right minerals and vitamins. Most nutrition experts recommend getting these nutrients from the foods that  you eat. In some cases, taking supplements may also be recommended. Doing certain types of exercise is also important for bone health. What are the nutritional recommendations for healthy bones? Eating a well-balanced diet with plenty of calcium and vitamin D will help to protect your bones. Nutritional recommendations vary from person to person. Ask your health care provider what is healthy for you. Here are some general guidelines. Get enough calcium Calcium is the most important (essential) mineral for bone health. Most people can get enough calcium from their diet, but supplements may be recommended for people who are at risk for osteoporosis. Good sources of calcium include:  Dairy products, such as low-fat or nonfat milk, cheese, and yogurt.  Dark green leafy vegetables, such as bok choy and broccoli.  Calcium-fortified foods, such as orange juice, cereal, bread, soy beverages, and tofu products.  Nuts, such as almonds. Follow these recommended amounts for daily calcium intake:  Children, age 639-3: 700 mg.  Children, age 63-8: 1,000 mg.  Children, age 39-13: 1,300 mg.  Teens, age 53-18: 1,300 mg.  Adults, age 77-50: 1,000 mg.  Adults, age 47-70: ? Men: 1,000 mg. ? Women: 1,200  mg.  Adults, age 55 or older: 1,200 mg.  Pregnant and breastfeeding females: ? Teens: 1,300 mg. ? Adults: 1,000 mg. Get enough vitamin D Vitamin D is the most essential vitamin for bone health. It helps the body absorb calcium. Sunlight stimulates the skin to make vitamin D, so be sure to get enough sunlight. If you live in a cold climate or you do not get outside often, your health care provider may recommend that you take vitamin D supplements. Good sources of vitamin D in your diet include:  Egg yolks.  Saltwater fish.  Milk and cereal fortified with vitamin D. Follow these recommended amounts for daily vitamin D intake:  Children and teens, age 639-18: 600 international units.  Adults, age 47 or younger: 400-800 international units.  Adults, age 636 or older: 800-1,000 international units. Get other important nutrients Other nutrients that are important for bone health include:  Phosphorus. This mineral is found in meat, poultry, dairy foods, nuts, and legumes. The recommended daily intake for adult men and adult women is 700 mg.  Magnesium. This mineral is found in seeds, nuts, dark green vegetables, and legumes. The recommended daily intake for adult men is 400-420 mg. For adult women, it is 310-320 mg.  Vitamin K. This vitamin is found in green leafy vegetables. The recommended daily intake is 120 mg for adult men and 90 mg for adult women.   What type of physical activity is best for building and maintaining healthy bones? Weight-bearing and strength-building activities are important for building and maintaining healthy bones. Weight-bearing activities cause muscles and bones to work against gravity. Strength-building activities increase the strength of the muscles that support bones. Weight-bearing and muscle-building activities include:  Walking and hiking.  Jogging and running.  Dancing.  Gym exercises.  Lifting weights.  Tennis and racquetball.  Climbing  stairs.  Aerobics. Adults should get at least 30 minutes of moderate physical activity on most days. Children should get at least 60 minutes of moderate physical activity on most days. Ask your health care provider what type of exercise is best for you.   How can I find out if my bone mass is low? Bone mass can be measured with an X-ray test called a bone mineral density (BMD) test. This test is recommended for all women who are age  60 or older. It may also be recommended for:  Men who are age 19 or older.  People who are at risk for osteoporosis because of: ? Having bones that break easily. ? Having a long-term disease that weakens bones, such as kidney disease or rheumatoid arthritis. ? Having menopause earlier than normal. ? Taking medicine that weakens bones, such as steroids, thyroid hormones, or hormone treatment for breast cancer or prostate cancer. ? Smoking. ? Drinking three or more alcoholic drinks a day. If you find that you have a low bone mass, you may be able to prevent osteoporosis or further bone loss by changing your diet and lifestyle. Where can I find more information? For more information, check out the following websites:  [place]: [employer]fr  [employer] of Health: www.bones.SouthExposed.es  International Osteoporosis Foundation: Administrator.iofbonehealth.org Summary  The aging process leads to an overall loss of bone mass in the body, which can increase the likelihood of broken bones and osteoporosis.  Eating a well-balanced diet with plenty of calcium and vitamin D will help to protect your bones.  Weight-bearing and strength-building activities are also important for building and maintaining strong bones.  Bone mass can be measured with an X-ray test called a bone mineral density (BMD) test. This information is not intended to replace advice given to you by your health care provider. Make sure you discuss any questions you have  with your health care provider. Document Revised: 06/29/2017 Document Reviewed: 06/29/2017 Elsevier Patient Education  2021 [place]. Vitamin D Test Why am I having this test? Vitamin D is a vitamin that your body makes when you are exposed to sunlight. It is also found naturally in fish and eggs, and it is added to some foods, such as cereals and dairy products. You need vitamin D to maintain bone health and to support your disease-fighting (immune) system. You may have a vitamin D test:  To help diagnose osteoporosis or other bone disorders.  To monitor treatment of osteoporosis or other bone disorders.  If you have symptoms of a lack (deficiency) of vitamin D, such as experiencing broken bones from minor injuries.  If you lack certain nutrients in your diet (dietary deficiencies).  If you have problems absorbing nutrients during digestion. What is being tested? There are two types of vitamin D tests that measure slightly different things:  The 25-hydroxyvitamin D test measures how much of a substance called 25-hydroxyvitamin D is in your blood. The body converts vitamin D to 25-hydroxyvitamin D in the liver. Measuring how much 25-hydroxyvitamin D is in your blood provides a good idea of your vitamin D levels. This is the most common type of vitamin D test.  The 1,25-dihydroxyvitamin D test measures how much of a substance called 1,25-dihydroxyvitamin D is in your blood. The body converts vitamin D into this substance and then uses it for various functions. This test provides an idea of your total vitamin D levels. What kind of sample is taken? A blood sample is required for this test. It is usually collected by inserting a needle into a blood vessel or by sticking a finger with a small needle.   Tell a health care provider about:  Any allergies you have.  All medicines you are taking, including vitamins, herbs, eye drops, creams, and over-the-counter medicines.  Any blood  disorders you have.  Any surgeries you have had.  Any medical conditions you have.  Whether you are pregnant or may be pregnant. How are the results  reported? Your test results will be reported as a value that tells you how much vitamin D is in your blood. Your health care provider will compare your results to normal ranges that were established after testing a large group of people (reference ranges). Reference ranges may vary among labs and hospitals. For vitamin D tests, common reference ranges are:  25-hydroxyvitamin D: 25-80 ng/mL.  1,25-dihydroxyvitamin D: ? Males: 18-64 pg/mL. ? Females: 18-78 pg/mL. What do the results mean? If your result is within your reference range, this means that you have a normal amount of vitamin D in your blood. If your result is lower than your reference range, this means that you have too little vitamin D in your body.  If you had the 25-hydroxyvitamin D test specifically: ? A result of 21-29 ng/mL means that you have slightly lower levels of vitamin D than normal (vitamin D insufficiency). ? A result of 20 ng/mL or less means that you have very low levels of vitamin D (vitamin D deficiency).  Low levels of vitamin D can indicate: ? Not having enough exposure to sunlight. ? Not eating enough foods that contain vitamin D. ? Osteoporosis. ? Kidney disease. ? Liver disease. ? A bone disease that makes the bones weak, soft, or poorly developed (rickets). ? Softening of the bones (osteomalacia). ? The body not absorbing vitamin D normally (gastrointestinal malabsorption). If your result is higher than your reference range, this means that you have too much vitamin D in your body. This can result from:  Taking too many dietary supplements.  Hyperparathyroidism. This is a rare condition in which you do not make enough of a certain type of hormone (parathyroid hormone or PTH).  A disease that causes inflammation in your organs and other areas of your  body (sarcoidosis).  A rare developmental disorder that is present at birth Jimmye Norman syndrome). Talk with your health care provider about what your results mean. Questions to ask your health care provider Ask your health care provider, or the department that is doing the test:  When will my results be ready?  How will I get my results?  What are my treatment options?  What other tests do I need?  What are my next steps? Summary  You may have a vitamin D test to determine if your body has enough vitamin D. You need vitamin D to maintain bone health and to support your disease-fighting (immune) system.  Your vitamin D level is determined with a blood test.  Talk with your health care provider about what your results mean. This information is not intended to replace advice given to you by your health care provider. Make sure you discuss any questions you have with your health care provider. Document Revised: 03/15/2020 Document Reviewed: 03/15/2020 Elsevier Patient Education  [place].

## 2020-07-26 NOTE — Progress Notes (Signed)
Subjective:  Patient ID: Regina Duran, female    DOB: April 09, 1976  Age: 45 y.o. MRN: [id number]  Chief Complaint  Patient presents with  . Fatigue        HPI  Regina Duran is a 45 year old Caucasian female that presents with fatigue.Rapid antigen COIVD-19 test negative today.She tells me she experienced 3-days of fatigue, hypersomnolence, and decreased appetite. She tells me her symptoms have resolved.Onset was 1 week-ago. She experienced a slight headache that was treated with Ibuprofen. BMI 19.22. She tells me she eats daily and sometimes eats "a lot" when she is hungry. She states she has had a small appetite throughout her lifetime. States her father is similar build and BMI.     Current Outpatient Medications on File Prior to Visit  Medication Sig Dispense Refill  . acyclovir (ZOVIRAX) 200 MG capsule Take 200 mg by mouth daily.    Marland Kitchen buPROPion (WELLBUTRIN XL) 300 MG 24 hr tablet TAKE 1 TABLET (300 MG TOTAL) BY MOUTH DAILY. PLEASE CALL TO SCHEDULE APPOINTMENT. THANK YOU, DR COX 30 tablet 2  . metoprolol tartrate (LOPRESSOR) 25 MG tablet Take 1 tablet (25 mg total) by mouth 2 (two) times daily. As needed. 180 tablet 0  . ondansetron (ZOFRAN) 4 MG tablet Take 1 tablet (4 mg total) by mouth every 8 (eight) hours as needed for nausea or vomiting. 20 tablet 0  . VIIBRYD 40 MG TABS Take 1 tablet (40 mg total) by mouth daily. 30 tablet 5   No current facility-administered medications on file prior to visit.   Past Medical History:  Diagnosis Date  . Abnormal EKG 08/08/2019  . Anxiety   . Cardiac murmur 08/24/2019  . Cervical stenosis of spine 11/06/2017  . Depression   . Ex-smoker 08/24/2019  . Major depressive disorder, recurrent, moderate ([place])   . Migraine without aura, not intractable, without status migrainosus   . Moderate episode of recurrent major depressive disorder ([place]) 08/09/2019  . Nausea 08/08/2019  . Other fatigue 08/08/2019  . Ovarian mass, right 01/01/2018  . Palpitation  08/09/2019  . Palpitations 08/24/2019  . Prominent abdominal aortic pulsation 08/24/2019  . Sinus tachycardia 08/24/2019   Past Surgical History:  Procedure Laterality Date  . Oophroectomy Right 06/16/2017   Right ovary removal  . TUBAL LIGATION  2005  . TUBAL LIGATION Bilateral 06/17/2003    Family History  Problem Relation Age of Onset  . Breast cancer Maternal Grandmother    Social History   Socioeconomic History  . Marital status: Legally Separated    Spouse name: Not on file  . Number of children: 3  . Years of education: 45  . Highest education level: Not on file  Occupational History  . Occupation: [employer]  Tobacco Use  . Smoking status: Current Every Day Smoker    Packs/day: 1.00    Types: Cigarettes, E-cigarettes    Last attempt to quit: 2019    Years since quitting: 3.1  . Smokeless tobacco: Never Used  Vaping Use  . Vaping Use: Never used  Substance and Sexual Activity  . Alcohol use: Not Currently  . Drug use: Never  . Sexual activity: Not on file  Other Topics Concern  . Not on file  Social History Narrative   ** Merged History Encounter **       Drinks a couple cups of coffee a day, also may have a soda or glass of tea a day   Social Determinants of Radio broadcast assistant Strain:  Not on file  Food Insecurity: Not on file  Transportation Needs: Not on file  Physical Activity: Not on file  Stress: Not on file  Social Connections: Not on file    Review of Systems  Constitutional: Positive for fatigue. Negative for fever.  HENT: Negative for congestion, ear pain, sinus pressure and sore throat.   Eyes: Negative for pain.  Respiratory: Negative for cough, chest tightness, shortness of breath and wheezing.   Cardiovascular: Negative for chest pain and palpitations.  Gastrointestinal: Negative for abdominal pain, constipation, diarrhea, nausea and vomiting.  Genitourinary: Negative for dysuria and hematuria.  Musculoskeletal: Negative for  arthralgias, back pain, joint swelling and myalgias.  Skin: Negative for rash.  Neurological: Negative for dizziness, weakness and headaches.  Psychiatric/Behavioral: Negative for dysphoric mood and sleep disturbance. The patient is not nervous/anxious.      Objective:  BP 118/68 (BP Location: Left Arm, Patient Position: Sitting)   Pulse 74   Temp (!) 97.5 F (36.4 C) (Temporal)   Ht 5\' 4"  (1.626 m)   Wt 112 lb (50.8 kg)   SpO2 98%   BMI 19.22 kg/m   BP/Weight 07/26/2020 06/13/2020 16/06/958  Systolic BP 454 098 119  Diastolic BP 68 72 62  Wt. (Lbs) 112 116 115  BMI 19.22 19.91 19.74    Physical Exam Constitutional:      Appearance: Normal appearance.  HENT:     Right Ear: Tympanic membrane, ear canal and external ear normal.     Left Ear: Tympanic membrane, ear canal and external ear normal.     Nose: Nose normal.     Mouth/Throat:     Mouth: Mucous membranes are moist.  Cardiovascular:     Rate and Rhythm: Normal rate and regular rhythm.     Pulses: Normal pulses.     Heart sounds: Normal heart sounds.  Pulmonary:     Effort: Pulmonary effort is normal.     Breath sounds: Normal breath sounds.  Abdominal:     Palpations: Abdomen is soft.  Musculoskeletal:        General: Normal range of motion.     Cervical back: Normal range of motion.  Skin:    General: Skin is warm and dry.  Neurological:     Mental Status: She is oriented to person, place, and time.  Psychiatric:        Mood and Affect: Mood normal.        Behavior: Behavior normal.        Thought Content: Thought content normal.        Judgment: Judgment normal.          Lab Results  Component Value Date   WBC 6.6 06/13/2020   HGB 11.9 06/13/2020   HCT 35.4 06/13/2020   PLT 276 06/13/2020   GLUCOSE 74 06/13/2020   ALT 8 06/13/2020   AST 18 06/13/2020   NA 142 06/13/2020   K 4.2 06/13/2020   CL 103 06/13/2020   CREATININE 0.77 06/13/2020   BUN 11 06/13/2020   CO2 26 06/13/2020   TSH  1.600 06/13/2020      Assessment & Plan:   1. Hypersomnia - POC COVID-19 BinaxNow - Vitamin D, 25-hydroxy - CBC with Differential - Comprehensive metabolic panel  2. Malaise and fatigue - POC COVID-19 BinaxNow - Vitamin D, 25-hydroxy - CBC with Differential - Comprehensive metabolic panel  3. Decreased appetite - POC COVID-19 BinaxNow - Vitamin D, 25-hydroxy - CBC with Differential - Comprehensive metabolic panel  4. Vapes nicotine containing substance - Cessation recommended   5. Body mass index (BMI) of 19.0-19.9 in adult -Small frequent calorie rich meals      Orders Placed This Encounter  Procedures  . POC COVID-19 BinaxNow    We will call you with lab results Continue medications as prescribed Follow up 42-months or sooner if needed    Follow-up: 6-MONTHS  An After Visit Summary was printed and given to the patient.  Rip Harbour, NP [place] [phone]

## 2020-07-27 LAB — COMPREHENSIVE METABOLIC PANEL
ALT: 12 IU/L (ref 0–32)
AST: 24 IU/L (ref 0–40)
Albumin/Globulin Ratio: 1.7 (ref 1.2–2.2)
Albumin: 4.7 g/dL (ref 3.8–4.8)
Alkaline Phosphatase: 73 IU/L (ref 44–121)
BUN/Creatinine Ratio: 22 (ref 9–23)
BUN: 17 mg/dL (ref 6–24)
Bilirubin Total: 0.4 mg/dL (ref 0.0–1.2)
CO2: 20 mmol/L (ref 20–29)
Calcium: 9.6 mg/dL (ref 8.7–10.2)
Chloride: 101 mmol/L (ref 96–106)
Creatinine, Ser: 0.76 mg/dL (ref 0.57–1.00)
GFR calc Af Amer: 109 mL/min/{1.73_m2} (ref >59)
GFR calc non Af Amer: 94 mL/min/{1.73_m2} (ref >59)
Globulin, Total: 2.8 g/dL (ref 1.5–4.5)
Glucose: 89 mg/dL (ref 65–99)
Potassium: 4.3 mmol/L (ref 3.5–5.2)
Sodium: 140 mmol/L (ref 134–144)
Total Protein: 7.5 g/dL (ref 6.0–8.5)

## 2020-07-27 LAB — CBC WITH DIFFERENTIAL/PLATELET
Basophils Absolute: 0 10*3/uL (ref 0.0–0.2)
Basos: 1 %
EOS (ABSOLUTE): 0 10*3/uL (ref 0.0–0.4)
Eos: 0 %
Hematocrit: 41.1 % (ref 34.0–46.6)
Hemoglobin: 13.6 g/dL (ref 11.1–15.9)
Immature Grans (Abs): 0 10*3/uL (ref 0.0–0.1)
Immature Granulocytes: 0 %
Lymphocytes Absolute: 1.5 10*3/uL (ref 0.7–3.1)
Lymphs: 24 %
MCH: 30.5 pg (ref 26.6–33.0)
MCHC: 33.1 g/dL (ref 31.5–35.7)
MCV: 92 fL (ref 79–97)
Monocytes Absolute: 0.6 10*3/uL (ref 0.1–0.9)
Monocytes: 9 %
Neutrophils Absolute: 4.1 10*3/uL (ref 1.4–7.0)
Neutrophils: 66 %
Platelets: 316 10*3/uL (ref 150–450)
RBC: 4.46 x10E6/uL (ref 3.77–5.28)
RDW: 12.5 % (ref 11.7–15.4)
WBC: 6.2 10*3/uL (ref 3.4–10.8)

## 2020-07-27 LAB — VITAMIN D 25 HYDROXY (VIT D DEFICIENCY, FRACTURES): Vit D, 25-Hydroxy: 22.7 ng/mL — ABNORMAL LOW (ref 30.0–100.0)

## 2020-07-29 ENCOUNTER — Other Ambulatory Visit: Payer: Self-pay | Admitting: Nurse Practitioner

## 2020-07-29 DIAGNOSIS — E559 Vitamin D deficiency, unspecified: Secondary | ICD-10-CM

## 2020-07-29 MED ORDER — VITAMIN D (ERGOCALCIFEROL) 1.25 MG (50000 UNIT) PO CAPS: 50000.0000 [IU] | ORAL_CAPSULE | ORAL | 1 refills | Status: DC

## 2020-08-15 ENCOUNTER — Other Ambulatory Visit: Payer: Self-pay | Admitting: Family Medicine

## 2020-08-15 DIAGNOSIS — F331 Major depressive disorder, recurrent, moderate: Secondary | ICD-10-CM

## 2020-08-15 NOTE — Telephone Encounter (Signed)
Your pt

## 2020-08-26 ENCOUNTER — Other Ambulatory Visit: Payer: Self-pay | Admitting: Nurse Practitioner

## 2020-08-26 DIAGNOSIS — E559 Vitamin D deficiency, unspecified: Secondary | ICD-10-CM

## 2020-08-27 ENCOUNTER — Telehealth: Payer: Self-pay

## 2020-08-27 NOTE — Telephone Encounter (Signed)
Patient called and left message stating that she was had some issues over the weekend , she stated that it started on Saturday afternoon she started feeling really tired exhausted and slept the rest of the day Saturday and slept Saturday night and woke up Sunday morning and got up took a shower and went right back to bed and slept Sunday and then Sunday night slept through it and woke up this morning and drug herself out of bed took a shower and was late to work but is at work. She doesn't know what caused it, she is wondering if maybe you have any idea or what the patient needs to do? She stated that she has been taking her vitamin d once a week and took her last one this morning. Please advise. LA

## 2020-08-27 NOTE — Telephone Encounter (Signed)
This patient has experienced the fatigue before. We thought maybe it was depression or anemia. Labs are normal (except Vit D) I think she would benefit from a sleep study and maybe a chest x-ray (she vapes)

## 2020-08-28 ENCOUNTER — Other Ambulatory Visit: Payer: Self-pay | Admitting: Cardiology

## 2020-08-28 NOTE — Telephone Encounter (Signed)
Patient informed, patient wants to be set up for at home sleep study and chest xray. LA

## 2020-08-28 NOTE — Telephone Encounter (Signed)
Metoprolol approved and sent 

## 2020-08-31 ENCOUNTER — Other Ambulatory Visit: Payer: Self-pay | Admitting: Nurse Practitioner

## 2020-08-31 DIAGNOSIS — R5383 Other fatigue: Secondary | ICD-10-CM

## 2020-09-22 ENCOUNTER — Other Ambulatory Visit: Payer: Self-pay | Admitting: Family Medicine

## 2020-10-16 ENCOUNTER — Telehealth: Payer: Self-pay

## 2020-10-16 NOTE — Telephone Encounter (Signed)
Patient called stating that for the past week or two she has been crying a lot lately, and states that there is nothing going on to increase that or start that and she stated it just comes and goes out of the blue and was wanting to know if she needs to do something with her medication or what she needs to do? Please advise what I would need to suggest to the patient? LA

## 2020-11-14 ENCOUNTER — Ambulatory Visit: Payer: 59 | Admitting: Cardiology

## 2020-12-04 ENCOUNTER — Other Ambulatory Visit: Payer: Self-pay | Admitting: Cardiology

## 2020-12-04 NOTE — Telephone Encounter (Signed)
Refill sent to pharmacy.   

## 2020-12-12 ENCOUNTER — Other Ambulatory Visit: Payer: Self-pay | Admitting: Nurse Practitioner

## 2020-12-12 ENCOUNTER — Telehealth: Payer: Self-pay

## 2020-12-12 DIAGNOSIS — R5383 Other fatigue: Secondary | ICD-10-CM

## 2020-12-12 DIAGNOSIS — G4709 Other insomnia: Secondary | ICD-10-CM

## 2020-12-12 NOTE — Telephone Encounter (Signed)
Pt calling states she would like to follow through with sleep study.   Royce Macadamia, [place] 12/12/20 10:17 AM

## 2020-12-25 ENCOUNTER — Other Ambulatory Visit: Payer: Self-pay

## 2020-12-25 DIAGNOSIS — F331 Major depressive disorder, recurrent, moderate: Secondary | ICD-10-CM

## 2020-12-25 MED ORDER — VILAZODONE HCL 40 MG PO TABS: 40.0000 mg | ORAL_TABLET | Freq: Every day | ORAL | 5 refills | Status: DC

## 2020-12-26 ENCOUNTER — Other Ambulatory Visit: Payer: Self-pay

## 2020-12-26 ENCOUNTER — Other Ambulatory Visit: Payer: 59

## 2020-12-26 ENCOUNTER — Other Ambulatory Visit: Payer: Self-pay | Admitting: Nurse Practitioner

## 2020-12-26 DIAGNOSIS — E559 Vitamin D deficiency, unspecified: Secondary | ICD-10-CM

## 2020-12-27 ENCOUNTER — Other Ambulatory Visit: Payer: Self-pay

## 2020-12-27 DIAGNOSIS — E559 Vitamin D deficiency, unspecified: Secondary | ICD-10-CM

## 2020-12-27 LAB — VITAMIN D 25 HYDROXY (VIT D DEFICIENCY, FRACTURES): Vit D, 25-Hydroxy: 45.6 ng/mL (ref 30.0–100.0)

## 2020-12-27 MED ORDER — VITAMIN D (ERGOCALCIFEROL) 1.25 MG (50000 UNIT) PO CAPS
50000.0000 [IU] | ORAL_CAPSULE | ORAL | 2 refills | Status: DC
Start: 2020-12-27 — End: 2022-01-06

## 2021-01-03 ENCOUNTER — Telehealth: Payer: Self-pay

## 2021-01-03 NOTE — Telephone Encounter (Signed)
Pt calling due to viibryd being too expensive. Pt has confirmed w/ pharmacy that price did increase on medication. Pt is requesting alternative due to expense.   Royce Macadamia, [place] 01/03/21 1:42 PM

## 2021-01-03 NOTE — Telephone Encounter (Signed)
Called pt. Pt Regina Duran.   Royce Macadamia, [place] 01/03/21 2:14 PM

## 2021-01-07 NOTE — Telephone Encounter (Signed)
Pt calling and states pharmacy advised pt even with coupon, the expense will still be high. Pt is requesting alternative. Pt has been w/o for 3 days and ahving side effects.   Royce Macadamia, [place] 01/07/21 11:40 AM

## 2021-01-07 NOTE — Telephone Encounter (Signed)
Attempted to call pt. No answer, left VM for call back to clinic.   Regina Duran 01/07/21 4:33 PM

## 2021-01-08 NOTE — Telephone Encounter (Signed)
Attempted to call pt. No answer, left VM for call back to clinic.   Royce Macadamia, [place] 01/08/21 9:02 AM

## 2021-01-09 NOTE — Telephone Encounter (Signed)
Attempted to call pt. No answer, left generic VM for call back to clinic. Second number is a business.   Regina Duran, [place] 01/09/21 9:29 AM

## 2021-01-22 NOTE — Progress Notes (Signed)
No show

## 2021-01-23 ENCOUNTER — Ambulatory Visit (INDEPENDENT_AMBULATORY_CARE_PROVIDER_SITE_OTHER): Payer: 59 | Admitting: Nurse Practitioner

## 2021-01-23 DIAGNOSIS — E559 Vitamin D deficiency, unspecified: Secondary | ICD-10-CM

## 2021-01-23 DIAGNOSIS — G4710 Hypersomnia, unspecified: Secondary | ICD-10-CM

## 2021-01-23 DIAGNOSIS — F331 Major depressive disorder, recurrent, moderate: Secondary | ICD-10-CM

## 2021-06-12 ENCOUNTER — Other Ambulatory Visit: Payer: Self-pay

## 2021-06-12 MED ORDER — BUPROPION HCL ER (XL) 300 MG PO TB24: 300.0000 mg | ORAL_TABLET | Freq: Every day | ORAL | 0 refills | Status: DC

## 2021-07-17 ENCOUNTER — Telehealth: Payer: Self-pay

## 2021-07-17 NOTE — Telephone Encounter (Signed)
I left a message on the number(s) listed in the patients chart requesting the patient to call back regarding the [facility] appointment for 08/07/2021. The provider is out of the office that day. The appointment has been canceled. Waiting for the patient to return the call.

## 2021-08-07 ENCOUNTER — Ambulatory Visit: Payer: 59 | Admitting: Nurse Practitioner

## 2021-08-14 ENCOUNTER — Other Ambulatory Visit: Payer: Self-pay

## 2021-08-14 ENCOUNTER — Encounter: Payer: Self-pay | Admitting: Nurse Practitioner

## 2021-08-14 ENCOUNTER — Ambulatory Visit: Payer: 59 | Admitting: Nurse Practitioner

## 2021-08-14 VITALS — BP 124/68 | HR 66 | Temp 97.2°F | Ht 65.0 in | Wt 130.0 lb

## 2021-08-14 DIAGNOSIS — Z0000 Encounter for general adult medical examination without abnormal findings: Secondary | ICD-10-CM

## 2021-08-14 DIAGNOSIS — Z1231 Encounter for screening mammogram for malignant neoplasm of breast: Secondary | ICD-10-CM | POA: Diagnosis not present

## 2021-08-14 MED ORDER — BUPROPION HCL ER (XL) 300 MG PO TB24: 300.0000 mg | ORAL_TABLET | Freq: Every day | ORAL | 0 refills | Status: DC

## 2021-08-14 MED ORDER — VITAMIN D (ERGOCALCIFEROL) 1.25 MG (50000 UNIT) PO CAPS: 50000.0000 [IU] | ORAL_CAPSULE | ORAL | 3 refills | Status: DC

## 2021-08-14 NOTE — Patient Instructions (Addendum)
Continue medications Resume Vit D weekly We will call you with lab results and pap smear appt/cholesterol check    Vitamin D Deficiency Vitamin D deficiency is when your body does not have enough vitamin D. Vitamin D is important to your body because: It helps your body use other minerals. It helps to keep your bones strong and healthy. It may help to prevent some diseases. It helps your heart and other muscles work well. Not getting enough vitamin D can make your bones soft. It can also cause other health problems. What are the causes? This condition may be caused by: Not eating enough foods that contain vitamin D. Not getting enough sun. Having diseases that make it hard for your body to absorb vitamin D. Having a surgery in which a part of the stomach or a part of the small intestine is removed. Having kidney disease or liver disease. What increases the risk? You are more likely to get this condition if: You are older. You do not spend much time outdoors. You live in a nursing home. You have had broken bones. You have weak or thin bones (osteoporosis). You have a disease or condition that changes how your body absorbs vitamin D. You have dark skin. You take certain medicines. You are overweight or obese. What are the signs or symptoms? In mild cases, there may not be any symptoms. If the condition is very bad, symptoms may include: Bone pain. Muscle pain. Falling often. Broken bones caused by a minor injury. How is this treated? Treatment may include taking supplements as told by your doctor. Your doctor will tell you what dose is best for you. Supplements may include: Vitamin D. Calcium. Follow these instructions at home: Eating and drinking  Eat foods that contain vitamin D, such as: Dairy products, cereals, or juices with added vitamin D. Check the label. Fish, such as salmon or trout. Eggs. Oysters. Mushrooms. The items listed above may not be a complete list  of what you can eat and drink. Contact a dietitian for more options. General instructions Take medicines and supplements only as told by your doctor. Get regular, safe exposure to natural sunlight. Do not use a tanning bed. Maintain a healthy weight. Lose weight if needed. Keep all follow-up visits as told by your doctor. This is important. How is this prevented? You can get vitamin D by: Eating foods that naturally contain vitamin D. Eating or drinking products that have vitamin D added to them, such as cereals, juices, and milk. Taking vitamin D or a multivitamin that contains vitamin D. Being in the sun. Your body makes vitamin D when your skin is exposed to sunlight. Your body changes the sunlight into a form of the vitamin that it can use. Contact a doctor if: Your symptoms do not go away. You feel sick to your stomach (nauseous). You throw up (vomit). You poop less often than normal, or you have trouble pooping (constipation). Summary Vitamin D deficiency is when your body does not have enough vitamin D. Vitamin D helps to keep your bones strong and healthy. This condition is often treated by taking a supplement. Your doctor will tell you what dose is best for you. This information is not intended to replace advice given to you by your health care provider. Make sure you discuss any questions you have with your health care provider. Document Revised: 02/08/2018 Document Reviewed: 02/08/2018 Elsevier Patient Education  [place] Maintenance, Female Adopting a healthy  lifestyle and getting preventive care are important in promoting health and wellness. Ask your health care provider about: The right schedule for you to have regular tests and exams. Things you can do on your own to prevent diseases and keep yourself healthy. What should I know about diet, weight, and exercise? Eat a healthy diet  Eat a diet that includes plenty of vegetables, fruits, low-fat  dairy products, and lean protein. Do not eat a lot of foods that are high in solid fats, added sugars, or sodium. Maintain a healthy weight Body mass index (BMI) is used to identify weight problems. It estimates body fat based on height and weight. Your health care provider can help determine your BMI and help you achieve or maintain a healthy weight. Get regular exercise Get regular exercise. This is one of the most important things you can do for your health. Most adults should: Exercise for at least 150 minutes each week. The exercise should increase your heart rate and make you sweat (moderate-intensity exercise). Do strengthening exercises at least twice a week. This is in addition to the moderate-intensity exercise. Spend less time sitting. Even light physical activity can be beneficial. Watch cholesterol and blood lipids Have your blood tested for lipids and cholesterol at 47 years of age, then have this test every 5 years. Have your cholesterol levels checked more often if: Your lipid or cholesterol levels are high. You are older than 48 years of age. You are at high risk for heart disease. What should I know about cancer screening? Depending on your health history and family history, you may need to have cancer screening at various ages. This may include screening for: Breast cancer. Cervical cancer. Colorectal cancer. Skin cancer. Lung cancer. What should I know about heart disease, diabetes, and high blood pressure? Blood pressure and heart disease High blood pressure causes heart disease and increases the risk of stroke. This is more likely to develop in people who have high blood pressure readings or are overweight. Have your blood pressure checked: Every 3-5 years if you are 57-52 years of age. Every year if you are 22 years old or older. Diabetes Have regular diabetes screenings. This checks your fasting blood sugar level. Have the screening done: Once every three years  after age 63 if you are at a normal weight and have a low risk for diabetes. More often and at a younger age if you are overweight or have a high risk for diabetes. What should I know about preventing infection? Hepatitis B If you have a higher risk for hepatitis B, you should be screened for this virus. Talk with your health care provider to find out if you are at risk for hepatitis B infection. Hepatitis C Testing is recommended for: Everyone born from [birth year] through 1967. Anyone with known risk factors for hepatitis C. Sexually transmitted infections (STIs) Get screened for STIs, including gonorrhea and chlamydia, if: You are sexually active and are younger than 48 years of age. You are older than 48 years of age and your health care provider tells you that you are at risk for this type of infection. Your sexual activity has changed since you were last screened, and you are at increased risk for chlamydia or gonorrhea. Ask your health care provider if you are at risk. Ask your health care provider about whether you are at high risk for HIV. Your health care provider may recommend a prescription medicine to help prevent HIV infection. If you choose to  take medicine to prevent HIV, you should first get tested for HIV. You should then be tested every 3 months for as long as you are taking the medicine. Pregnancy If you are about to stop having your period (premenopausal) and you may become pregnant, seek counseling before you get pregnant. Take 400 to 800 micrograms (mcg) of folic acid every day if you become pregnant. Ask for birth control (contraception) if you want to prevent pregnancy. Osteoporosis and menopause Osteoporosis is a disease in which the bones lose minerals and strength with aging. This can result in bone fractures. If you are 37 years old or older, or if you are at risk for osteoporosis and fractures, ask your health care provider if you should: Be screened for bone loss. Take a  calcium or vitamin D supplement to lower your risk of fractures. Be given hormone replacement therapy (HRT) to treat symptoms of menopause. Follow these instructions at home: Alcohol use Do not drink alcohol if: Your health care provider tells you not to drink. You are pregnant, may be pregnant, or are planning to become pregnant. If you drink alcohol: Limit how much you have to: 0-1 drink a day. Know how much alcohol is in your drink. In the [place] one drink equals one 12 oz bottle of beer (355 mL), one 5 oz glass of wine (148 mL), or one 1 oz glass of hard liquor (44 mL). Lifestyle Do not use any products that contain nicotine or tobacco. These products include cigarettes, chewing tobacco, and vaping devices, such as e-cigarettes. If you need help quitting, ask your health care provider. Do not use street drugs. Do not share needles. Ask your health care provider for help if you need support or information about quitting drugs. General instructions Schedule regular health, dental, and eye exams. Stay current with your vaccines. Tell your health care provider if: You often feel depressed. You have ever been abused or do not feel safe at home. Summary Adopting a healthy lifestyle and getting preventive care are important in promoting health and wellness. Follow your health care provider's instructions about healthy diet, exercising, and getting tested or screened for diseases. Follow your health care provider's instructions on monitoring your cholesterol and blood pressure. This information is not intended to replace advice given to you by your health care provider. Make sure you discuss any questions you have with your health care provider. Document Revised: 10/22/2020 Document Reviewed: 10/22/2020 Elsevier Patient Education  [place].

## 2021-08-14 NOTE — Progress Notes (Deleted)
? ?Subjective:  ?Patient ID: Regina Duran, female    DOB: 05-Nov-1975  Age: 46 y.o. MRN: [id number] ? ?Chief Complaint  ?Patient presents with  ? Depression  ? ? ?HPI ? ?Depression, Follow-up ? ?She  was last seen for this 6 months ago. ?Changes made at last visit include none. ?  ?She reports excellent compliance with treatment. ?She is not having side effects.  ? ?She reports excellent tolerance of treatment. ? ? ?Depression screen [facility] 2/9 08/14/2021 06/16/2020  ?Decreased Interest 0 1  ?Down, Depressed, Hopeless 0 2  ?PHQ - 2 Score 0 3  ?Altered sleeping 0 1  ?Tired, decreased energy 0 1  ?Change in appetite 0 2  ?Feeling bad or failure about yourself  0 1  ?Trouble concentrating 0 1  ?Moving slowly or fidgety/restless 0 0  ?Suicidal thoughts 0 0  ?PHQ-9 Score 0 9  ?Difficult doing work/chores Not difficult at all Very difficult  ?  ? ? ? ?Hypersomnia-see's a Sleep Specialist  ? ?Current Outpatient Medications on File Prior to Visit  ?Medication Sig Dispense Refill  ? acyclovir (ZOVIRAX) 200 MG capsule Take 200 mg by mouth daily.    ? buPROPion (WELLBUTRIN XL) 300 MG 24 hr tablet Take 1 tablet (300 mg total) by mouth daily. 90 tablet 0  ? metoprolol tartrate (LOPRESSOR) 25 MG tablet TAKE 1 TABLET (25 MG TOTAL) BY MOUTH 2 (TWO) TIMES DAILY. AS NEEDED. 180 tablet 0  ? Vitamin D, Ergocalciferol, (DRISDOL) 1.25 MG (50000 UNIT) CAPS capsule Take 1 capsule (50,000 Units total) by mouth once a week. (Patient not taking: Reported on 08/14/2021) 4 capsule 2  ? ?No current facility-administered medications on file prior to visit.  ? ?Past Medical History:  ?Diagnosis Date  ? Abnormal EKG 08/08/2019  ? Anxiety   ? Cardiac murmur 08/24/2019  ? Cervical stenosis of spine 11/06/2017  ? Depression   ? Ex-smoker 08/24/2019  ? Major depressive disorder, recurrent, moderate ([place])   ? Migraine without aura, not intractable, without status migrainosus   ? Moderate episode of recurrent major depressive disorder ([place]) 08/09/2019  ? Nausea  08/08/2019  ? Other fatigue 08/08/2019  ? Ovarian mass, right 01/01/2018  ? Palpitation 08/09/2019  ? Palpitations 08/24/2019  ? Prominent abdominal aortic pulsation 08/24/2019  ? Sinus tachycardia 08/24/2019  ? ?Past Surgical History:  ?Procedure Laterality Date  ? Oophroectomy Right 06/16/2017  ? Right ovary removal  ? TUBAL LIGATION  2005  ? TUBAL LIGATION Bilateral 06/17/2003  ?  ?Family History  ?Problem Relation Age of Onset  ? Breast cancer Maternal Grandmother   ? ?Social History  ? ?Socioeconomic History  ? Marital status: Divorced  ?  Spouse name: Not on file  ? Number of children: 3  ? Years of education: 85  ? Highest education level: Not on file  ?Occupational History  ? Occupation: [employer]  ?Tobacco Use  ? Smoking status: Every Day  ?  Packs/day: 1.00  ?  Types: Cigarettes, E-cigarettes  ?  Last attempt to quit: 2019  ?  Years since quitting: 4.1  ? Smokeless tobacco: Never  ?Vaping Use  ? Vaping Use: Never used  ?Substance and Sexual Activity  ? Alcohol use: Not Currently  ? Drug use: Never  ? Sexual activity: Not on file  ?Other Topics Concern  ? Not on file  ?Social History Narrative  ? ** Merged History Encounter **  ?    ? Drinks a couple cups of coffee a day,  also may have a soda or glass of tea a day  ? ?Social Determinants of Health  ? ?Financial Resource Strain: Low Risk   ? Difficulty of Paying Living Expenses: Not hard at all  ?Food Insecurity: No Food Insecurity  ? Worried About Charity fundraiser in the Last Year: Never true  ? Ran Out of Food in the Last Year: Never true  ?Transportation Needs: No Transportation Needs  ? Lack of Transportation (Medical): No  ? Lack of Transportation (Non-Medical): No  ?Physical Activity: Inactive  ? Days of Exercise per Week: 0 days  ? Minutes of Exercise per Session: 0 min  ?Stress: No Stress Concern Present  ? Feeling of Stress : Not at all  ?Social Connections: Socially Isolated  ? Frequency of Communication with Friends and Family: Three times a week  ?  Frequency of Social Gatherings with Friends and Family: Three times a week  ? Attends Religious Services: Never  ? Active Member of Clubs or Organizations: No  ? Attends Archivist Meetings: Never  ? Marital Status: Divorced  ? ? ?Review of Systems  ?Constitutional:  Negative for chills, fatigue and fever.  ?HENT:  Negative for congestion, ear pain, rhinorrhea and sore throat.   ?Respiratory:  Negative for cough and shortness of breath.   ?Cardiovascular:  Negative for chest pain.  ?Gastrointestinal:  Negative for abdominal pain, constipation, diarrhea, nausea and vomiting.  ?Genitourinary:  Negative for dysuria and urgency.  ?Musculoskeletal:  Positive for arthralgias (BL Hip Pain). Negative for back pain and myalgias.  ?Neurological:  Negative for dizziness, weakness, light-headedness and headaches.  ?Psychiatric/Behavioral:  Negative for dysphoric mood. The patient is not nervous/anxious.   ? ? ?Objective:  ?BP 124/68   Pulse 66   Temp (!) 97.2 ?F (36.2 ?C)   Ht 5\' 5"  (1.651 m)   Wt 130 lb (59 kg)   SpO2 99%   BMI 21.63 kg/m?  ? ?BP/Weight 08/14/2021 07/26/2020 06/13/2020  ?Systolic BP 546 503 546  ?Diastolic BP 68 68 72  ?Wt. (Lbs) 130 112 116  ?BMI 21.63 19.22 19.91  ? ? ?Physical Exam ? ?Diabetic Foot Exam - Simple   ?No data filed ?  ?  ? ?Lab Results  ?Component Value Date  ? WBC 6.2 07/26/2020  ? HGB 13.6 07/26/2020  ? HCT 41.1 07/26/2020  ? PLT 316 07/26/2020  ? GLUCOSE 89 07/26/2020  ? ALT 12 07/26/2020  ? AST 24 07/26/2020  ? NA 140 07/26/2020  ? K 4.3 07/26/2020  ? CL 101 07/26/2020  ? CREATININE 0.76 07/26/2020  ? BUN 17 07/26/2020  ? CO2 20 07/26/2020  ? TSH 1.600 06/13/2020  ? ? ? ? ?Assessment & Plan:  ? ? ?. ? ? ? ?  ? ?Follow-up: 1-year ? ?An After Visit Summary was printed and given to the patient. ? ?Rip Harbour, NP ?[place] ?[phone] ?

## 2021-08-14 NOTE — Progress Notes (Addendum)
Subjective:  Patient ID: Regina Duran, female    DOB: 1975/10/09  Age: 46 y.o. MRN: [id number]  Chief Complaint  Patient presents with   Annual physical exam   HPI: Regina Duran is a 46 year old Caucasian female that presents for annual physical exam. Current menses, pap smear deferred.  Encounter for general adult medical examination without abnormal findings  Physical: Patient's last physical exam was 1 year ago .      SDOH Screenings   Alcohol Screen: Low Risk    Last Alcohol Screening Score (AUDIT): 0  Depression (PHQ2-9): Low Risk    PHQ-2 Score: 0  Financial Resource Strain: Low Risk    Difficulty of Paying Living Expenses: Not hard at all  Food Insecurity: No Food Insecurity   Worried About Charity fundraiser in the Last Year: Never true   Ran Out of Food in the Last Year: Never true  Housing: Low Risk    Last Housing Risk Score: 0  Physical Activity: Inactive   Days of Exercise per Week: 0 days   Minutes of Exercise per Session: 0 min  Social Connections: Socially Isolated   Frequency of Communication with Friends and Family: Three times a week   Frequency of Social Gatherings with Friends and Family: Three times a week   Attends Religious Services: Never   Active Member of Clubs or Organizations: No   Attends Archivist Meetings: Never   Marital Status: Divorced  Stress: No Stress Concern Present   Feeling of Stress : Not at all  Tobacco Use: High Risk   Smoking Tobacco Use: Every Day   Smokeless Tobacco Use: Never   Passive Exposure: Not on file  Transportation Needs: No Transportation Needs   Lack of Transportation (Medical): No   Lack of Transportation (Non-Medical): No    Fall Risk  08/14/2021 06/13/2020  Falls in the past year? 0 0  Number falls in past yr: 0 0  Injury with Fall? 0 0  Risk for fall due to : No Fall Risks -  Follow up Falls evaluation completed -    Depression screen [facility] 2/9 08/14/2021 06/16/2020  Decreased Interest 0 1   Down, Depressed, Hopeless 0 2  PHQ - 2 Score 0 3  Altered sleeping 0 1  Tired, decreased energy 0 1  Change in appetite 0 2  Feeling bad or failure about yourself  0 1  Trouble concentrating 0 1  Moving slowly or fidgety/restless 0 0  Suicidal thoughts 0 0  PHQ-9 Score 0 9  Difficult doing work/chores Not difficult at all Very difficult    Functional Status Survey:     Safety: reviewed ;  Patient wears a seat belt. Patient's home has smoke detectors and carbon monoxide detectors. Patient practices appropriate gun safety Patient wears sunscreen with extended sun exposure. Dental Care: biannual cleanings, brushes and flosses daily. Ophthalmology/Optometry: Annual visit.  Hearing loss: none Vision impairments: none Patient is not afflicted from Stress Incontinence and Urge Incontinence   Current Outpatient Medications on File Prior to Visit  Medication Sig Dispense Refill   acyclovir (ZOVIRAX) 200 MG capsule Take 200 mg by mouth daily.     metoprolol tartrate (LOPRESSOR) 25 MG tablet TAKE 1 TABLET (25 MG TOTAL) BY MOUTH 2 (TWO) TIMES DAILY. AS NEEDED. 180 tablet 0   Vitamin D, Ergocalciferol, (DRISDOL) 1.25 MG (50000 UNIT) CAPS capsule Take 1 capsule (50,000 Units total) by mouth once a week. (Patient not taking: Reported on 08/14/2021) 4 capsule 2  No current facility-administered medications on file prior to visit.    Social Hx   Social History   Socioeconomic History   Marital status: Divorced    Spouse name: Not on file   Number of children: 3   Years of education: 12   Highest education level: Not on file  Occupational History   Occupation: [employer]  Tobacco Use   Smoking status: Every Day    Packs/day: 1.00    Types: Cigarettes, E-cigarettes    Last attempt to quit: 2019    Years since quitting: 4.1   Smokeless tobacco: Never  Vaping Use   Vaping Use: Never used  Substance and Sexual Activity   Alcohol use: Not Currently   Drug use: Never   Sexual  activity: Not on file  Other Topics Concern   Not on file  Social History Narrative   ** Merged History Encounter **       Drinks a couple cups of coffee a day, also may have a soda or glass of tea a day   Social Determinants of Radio broadcast assistant Strain: Low Risk    Difficulty of Paying Living Expenses: Not hard at all  Food Insecurity: No Food Insecurity   Worried About Charity fundraiser in the Last Year: Never true   Arboriculturist in the Last Year: Never true  Transportation Needs: No Transportation Needs   Lack of Transportation (Medical): No   Lack of Transportation (Non-Medical): No  Physical Activity: Inactive   Days of Exercise per Week: 0 days   Minutes of Exercise per Session: 0 min  Stress: No Stress Concern Present   Feeling of Stress : Not at all  Social Connections: Socially Isolated   Frequency of Communication with Friends and Family: Three times a week   Frequency of Social Gatherings with Friends and Family: Three times a week   Attends Religious Services: Never   Active Member of Clubs or Organizations: No   Attends Archivist Meetings: Never   Marital Status: Divorced   Past Medical History:  Diagnosis Date   Abnormal EKG 08/08/2019   Anxiety    Cardiac murmur 08/24/2019   Cervical stenosis of spine 11/06/2017   Depression    Ex-smoker 08/24/2019   Major depressive disorder, recurrent, moderate ([facility])    Migraine without aura, not intractable, without status migrainosus    Moderate episode of recurrent major depressive disorder ([place]) 08/09/2019   Nausea 08/08/2019   Other fatigue 08/08/2019   Ovarian mass, right 01/01/2018   Palpitation 08/09/2019   Palpitations 08/24/2019   Prominent abdominal aortic pulsation 08/24/2019   Sinus tachycardia 08/24/2019   Family History  Problem Relation Age of Onset   Breast cancer Maternal Grandmother     Review of Systems  Constitutional:  Negative for appetite change, fatigue and unexpected  weight change.  HENT:  Negative for congestion, ear pain, rhinorrhea, sinus pressure, sinus pain and tinnitus.   Eyes:  Negative for pain.  Respiratory:  Negative for cough and shortness of breath.   Cardiovascular:  Negative for chest pain, palpitations and leg swelling.  Gastrointestinal:  Negative for abdominal pain, constipation, diarrhea, nausea and vomiting.  Endocrine: Negative for cold intolerance, heat intolerance, polydipsia, polyphagia and polyuria.  Genitourinary:  Negative for dysuria, frequency and hematuria.  Musculoskeletal:  Negative for arthralgias, back pain, joint swelling and myalgias.  Skin:  Negative for rash.  Allergic/Immunologic: Negative for environmental allergies.  Neurological:  Negative for  dizziness and headaches.  Hematological:  Negative for adenopathy.  Psychiatric/Behavioral:  Negative for decreased concentration and sleep disturbance. The patient is not nervous/anxious.     Objective:  BP 124/68   Pulse 66   Temp (!) 97.2 F (36.2 C)   Ht 5\' 5"  (1.651 m)   Wt 130 lb (59 kg)   SpO2 99%   BMI 21.63 kg/m   BP/Weight 08/14/2021 07/26/2020 93/23/5573  Systolic BP 220 254 270  Diastolic BP 68 68 72  Wt. (Lbs) 130 112 116  BMI 21.63 19.22 19.91    Physical Exam Vitals reviewed.  Constitutional:      Appearance: Normal appearance.  HENT:     Head: Normocephalic.     Right Ear: Tympanic membrane normal.     Left Ear: Tympanic membrane normal.     Nose: Nose normal.     Mouth/Throat:     Mouth: Mucous membranes are moist.  Eyes:     Pupils: Pupils are equal, round, and reactive to light.  Cardiovascular:     Rate and Rhythm: Normal rate and regular rhythm.     Pulses: Normal pulses.     Heart sounds: Normal heart sounds.  Pulmonary:     Effort: Pulmonary effort is normal.     Breath sounds: Normal breath sounds.  Abdominal:     General: Bowel sounds are normal.     Palpations: Abdomen is soft.  Musculoskeletal:        General: Normal  range of motion.     Cervical back: Neck supple.  Skin:    General: Skin is warm and dry.     Capillary Refill: Capillary refill takes less than 2 seconds.  Neurological:     General: No focal deficit present.     Mental Status: She is alert and oriented to person, place, and time.  Psychiatric:        Mood and Affect: Mood normal.        Behavior: Behavior normal.    Lab Results  Component Value Date   WBC 6.2 07/26/2020   HGB 13.6 07/26/2020   HCT 41.1 07/26/2020   PLT 316 07/26/2020   GLUCOSE 89 07/26/2020   ALT 12 07/26/2020   AST 24 07/26/2020   NA 140 07/26/2020   K 4.3 07/26/2020   CL 101 07/26/2020   CREATININE 0.76 07/26/2020   BUN 17 07/26/2020   CO2 20 07/26/2020   TSH 1.600 06/13/2020      Assessment & Plan:   1. Annual physical exam - CBC with Differential/Platelet - Comprehensive metabolic panel - TSH - VITAMIN D 25 Hydroxy (Vit-D Deficiency, Fractures)  2. Encounter for screening mammogram for malignant neoplasm of breast - MM DIGITAL SCREENING BILATERAL; Future    Meds ordered this encounter  Medications   buPROPion (WELLBUTRIN XL) 300 MG 24 hr tablet    Sig: Take 1 tablet (300 mg total) by mouth daily.    Dispense:  90 tablet    Refill:  0   Vitamin D, Ergocalciferol, (DRISDOL) 1.25 MG (50000 UNIT) CAPS capsule    Sig: Take 1 capsule (50,000 Units total) by mouth every 7 (seven) days.    Dispense:  5 capsule    Refill:  3    Order Specific Question:   Supervising Provider    Answer:   Shelton Silvas    This is a list of the screening recommended for you and due dates:  Health Maintenance  Topic Date Due  Pap Smear  04/21/2021   Mammogram  08/13/2021   Flu Shot  09/13/2021*   Colon Cancer Screening  08/15/2022*   Tetanus Vaccine  03/17/2027   HPV Vaccine  Aged Out   COVID-19 Vaccine  Discontinued   Hepatitis C Screening: USPSTF Recommendation to screen - Ages 18-79 yo.  Discontinued   HIV Screening  Discontinued  *Topic  was postponed. The date shown is not the original due date.      AN INDIVIDUALIZED CARE PLAN: was established or reinforced today.   SELF MANAGEMENT: The patient and I together assessed ways to personally work towards obtaining the recommended goals  Support needs The patient and/or family needs were assessed and services were offered and not necessary at this time.    Follow-up: Return in about 1 year (around 08/15/2022) for CPE; will return for pap smear.  Signed, [place] [phone]

## 2021-08-15 LAB — CBC WITH DIFFERENTIAL/PLATELET
Basophils Absolute: 0 10*3/uL (ref 0.0–0.2)
Basos: 1 %
EOS (ABSOLUTE): 0.1 10*3/uL (ref 0.0–0.4)
Eos: 2 %
Hematocrit: 34.5 % (ref 34.0–46.6)
Hemoglobin: 11.6 g/dL (ref 11.1–15.9)
Immature Grans (Abs): 0 10*3/uL (ref 0.0–0.1)
Immature Granulocytes: 0 %
Lymphocytes Absolute: 2 10*3/uL (ref 0.7–3.1)
Lymphs: 38 %
MCH: 30.6 pg (ref 26.6–33.0)
MCHC: 33.6 g/dL (ref 31.5–35.7)
MCV: 91 fL (ref 79–97)
Monocytes Absolute: 0.6 10*3/uL (ref 0.1–0.9)
Monocytes: 10 %
Neutrophils Absolute: 2.7 10*3/uL (ref 1.4–7.0)
Neutrophils: 49 %
Platelets: 302 10*3/uL (ref 150–450)
RBC: 3.79 x10E6/uL (ref 3.77–5.28)
RDW: 12.4 % (ref 11.7–15.4)
WBC: 5.4 10*3/uL (ref 3.4–10.8)

## 2021-08-15 LAB — VITAMIN D 25 HYDROXY (VIT D DEFICIENCY, FRACTURES): Vit D, 25-Hydroxy: 23.2 ng/mL — ABNORMAL LOW (ref 30.0–100.0)

## 2021-08-15 LAB — COMPREHENSIVE METABOLIC PANEL
ALT: 8 IU/L (ref 0–32)
AST: 14 IU/L (ref 0–40)
Albumin/Globulin Ratio: 1.9 (ref 1.2–2.2)
Albumin: 4.5 g/dL (ref 3.8–4.8)
Alkaline Phosphatase: 59 IU/L (ref 44–121)
BUN/Creatinine Ratio: 15 (ref 9–23)
BUN: 12 mg/dL (ref 6–24)
Bilirubin Total: <0.2 mg/dL (ref 0.0–1.2)
CO2: 28 mmol/L (ref 20–29)
Calcium: 9.3 mg/dL (ref 8.7–10.2)
Chloride: 104 mmol/L (ref 96–106)
Creatinine, Ser: 0.79 mg/dL (ref 0.57–1.00)
Globulin, Total: 2.4 g/dL (ref 1.5–4.5)
Glucose: 82 mg/dL (ref 70–99)
Potassium: 4.4 mmol/L (ref 3.5–5.2)
Sodium: 145 mmol/L — ABNORMAL HIGH (ref 134–144)
Total Protein: 6.9 g/dL (ref 6.0–8.5)
eGFR: 93 mL/min/{1.73_m2} (ref >59)

## 2021-08-15 LAB — TSH: TSH: 1.47 u[IU]/mL (ref 0.450–4.500)

## 2021-09-02 ENCOUNTER — Ambulatory Visit (INDEPENDENT_AMBULATORY_CARE_PROVIDER_SITE_OTHER): Payer: 59 | Admitting: Nurse Practitioner

## 2021-09-02 ENCOUNTER — Encounter: Payer: Self-pay | Admitting: Nurse Practitioner

## 2021-09-02 ENCOUNTER — Other Ambulatory Visit: Payer: Self-pay

## 2021-09-02 VITALS — BP 118/80 | HR 73 | Temp 97.2°F | Ht 65.0 in | Wt 127.0 lb

## 2021-09-02 DIAGNOSIS — Z83438 Family history of other disorder of lipoprotein metabolism and other lipidemia: Secondary | ICD-10-CM

## 2021-09-02 DIAGNOSIS — Z124 Encounter for screening for malignant neoplasm of cervix: Secondary | ICD-10-CM | POA: Diagnosis not present

## 2021-09-02 NOTE — Patient Instructions (Addendum)
Continue medications ?We will call you with lab results ?Follow-up in 1-year or sooner if needed ? ? ?Managing the Challenge of Quitting Smoking ?Quitting smoking is a physical and mental challenge. You will face cravings, withdrawal symptoms, and temptation. Before quitting, work with your health care provider to make a plan that can help you manage quitting. Preparation can help you quit and keep you from giving in. ?How to manage lifestyle changes ?Managing stress ?Stress can make you want to smoke, and wanting to smoke may cause stress. It is important to find ways to manage your stress. You might try some of the following: ?Practice relaxation techniques. ?Breathe slowly and deeply, in through your nose and out through your mouth. ?Listen to music. ?Soak in a bath or take a shower. ?Imagine a peaceful place or vacation. ?Get some support. ?Talk with family or friends about your stress. ?Join a support group. ?Talk with a counselor or therapist. ?Get some physical activity. ?Go for a walk, run, or bike ride. ?Play a favorite sport. ?Practice yoga. ? ?Medicines ?Talk with your health care provider about medicines that might help you deal with cravings and make quitting easier for you. ?Relationships ?Social situations can be difficult when you are quitting smoking. To manage this, you can: ?Avoid parties and other social situations where people might be smoking. ?Avoid alcohol. ?Leave right away if you have the urge to smoke. ?Explain to your family and friends that you are quitting smoking. Ask for support and let them know you might be a bit grumpy. ?Plan activities where smoking is not an option. ?General instructions ?Be aware that many people gain weight after they quit smoking. However, not everyone does. To keep from gaining weight, have a plan in place before you quit and stick to the plan after you quit. Your plan should include: ?Having healthy snacks. When you have a craving, it may help to: ?Eat  popcorn, carrots, celery, or other cut vegetables. ?Chew sugar-free gum. ?Changing how you eat. ?Eat small portion sizes at meals. ?Eat 4-6 small meals throughout the day instead of 1-2 large meals a day. ?Be mindful when you eat. Do not watch television or do other things that might distract you as you eat. ?Exercising regularly. ?Make time to exercise each day. If you do not have time for a long workout, do short bouts of exercise for 5-10 minutes several times a day. ?Do some form of strengthening exercise, such as weight lifting. ?Do some exercise that gets your heart beating and causes you to breathe deeply, such as walking fast, running, swimming, or biking. This is very important. ?Drinking plenty of water or other low-calorie or no-calorie drinks. Drink 6-8 glasses of water daily. ? ?How to recognize withdrawal symptoms ?Your body and mind may experience discomfort as you try to get used to not having nicotine in your system. These effects are called withdrawal symptoms. They may include: ?Feeling hungrier than normal. ?Having trouble concentrating. ?Feeling irritable or restless. ?Having trouble sleeping. ?Feeling depressed. ?Craving a cigarette. ?To manage withdrawal symptoms: ?Avoid places, people, and activities that trigger your cravings. ?Remember why you want to quit. ?Get plenty of sleep. ?Avoid coffee and other caffeinated drinks. These may worsen some of your symptoms. ?These symptoms may surprise you. But be assured that they are normal to have when quitting smoking. ?How to manage cravings ?Come up with a plan for how to deal with your cravings. The plan should include the following: ?A definition of the specific situation  you want to deal with. ?An alternative action you will take. ?A clear idea for how this action will help. ?The name of someone who might help you with this. ?Cravings usually last for 5-10 minutes. Consider taking the following actions to help you with your plan to deal with  cravings: ?Keep your mouth busy. ?Chew sugar-free gum. ?Suck on hard candies or a straw. ?Brush your teeth. ?Keep your hands and body busy. ?Change to a different activity right away. ?Squeeze or play with a ball. ?Do an activity or a hobby, such as making bead jewelry, practicing needlepoint, or working with wood. ?Mix up your normal routine. ?Take a short exercise break. Go for a quick walk or run up and down stairs. ?Focus on doing something kind or helpful for someone else. ?Call a friend or family member to talk during a craving. ?Join a support group. ?Contact a quitline. ?Where to find support ?To get help or find a support group: ?Call the [facility]'s Smoking Quitline: 1-800-QUIT NOW 425-483-5513) ?Visit the website of the Substance Abuse and Mental Health Services Administration: ktimeonline.com ?Text QUIT to SmokefreeTXT: 833825 ?Where to find more information ?Visit these websites to find more information on quitting smoking: ?[place]: www.smokefree.gov ?American Lung Association: www.lung.org ?American Cancer Society: www.cancer.org ?Centers for Disease Control and Prevention: http://www.wolf.info/ ?American Heart Association: www.heart.org ?Contact a health care provider if: ?You want to change your plan for quitting. ?The medicines you are taking are not helping. ?Your eating feels out of control or you cannot sleep. ?Get help right away if: ?You feel depressed or become very anxious. ?Summary ?Quitting smoking is a physical and mental challenge. You will face cravings, withdrawal symptoms, and temptation to smoke again. Preparation can help you as you go through these challenges. ?Try different techniques to manage stress, handle social situations, and prevent weight gain. ?You can deal with cravings by keeping your mouth busy (such as by chewing gum), keeping your hands and body busy, calling family or friends, or contacting a quitline for people who want to quit smoking. ?You can  deal with withdrawal symptoms by avoiding places where people smoke, getting plenty of rest, and avoiding drinks with caffeine. ?This information is not intended to replace advice given to you by your health care provider. Make sure you discuss any questions you have with your health care provider. ?Document Revised: 02/08/2021 Document Reviewed: 03/22/2019 ?Elsevier Patient Education ? [place]. ? ? ?Preventing Vitamin D Deficiency ?Vitamin D is a nutrient that helps your body absorb calcium from food. It plays a key role in the health of bones and teeth, muscle function, and infection prevention. ?Our bodies make vitamin D when our skin is exposed to direct sunlight. However, for many people, this may not be enough vitamin D to meet the body's needs. When you get too little vitamin D, it is called a deficiency. ?How can this condition affect me? ?A vitamin D deficiency can put you at risk of developing conditions that cause bones to be brittle, such as rickets or osteoporosis. If you are over age 53, not having enough vitamin D may weaken your muscles and bones and increase your risk for falls and broken bones. ?What can increase my risk? ?You may be at risk for a vitamin D deficiency if you: ?Are pregnant. ?Are obese. ?Are over 101 years old. ?Have dark skin. ?Take certain medicines that affect the way vitamin D is absorbed. ?Have had gastric bypass surgery. ?Other risk factors include: ?  Having a condition that limits your ability to absorb fat, such as cystic fibrosis, celiac disease, or inflammatory bowel disease. ?Having certain inherited conditions. ?Not having access to foods rich in vitamin D. ?Having limited ability to move. ?Living in areas that have fewer hours of sunlight. ?Spending most of your day indoors, or you cover your skin all the time when you are outdoors. ?Breastfed infants are also at risk for vitamin D deficiency. ?What actions can I take to reduce my risk of a vitamin D  deficiency? ?Knowing the best sources of vitamin D ?You can meet your daily vitamin D needs from: ?Foods. ?Dietary supplements. ?Direct exposure to natural sunlight. ?Infant formula (for babies). ?Knowing how much vit

## 2021-09-02 NOTE — Progress Notes (Deleted)
? ?Subjective:  ?Patient ID: Regina Duran, female    DOB: 05-19-1976  Age: 46 y.o. MRN: [id number] ? ?Chief Complaint  ?Patient presents with  ? Annual Exam  ? ? ?HPI ?Encounter for general adult medical examination without abnormal findings  ?Physical ("At Risk" items are starred): Patient's last physical exam was 1 year ago .  ? ?Growth percentile SmartLinks can only be used for patients less than 80 years old.   ?SDOH Screenings  ? ?Alcohol Screen: Low Risk   ? Last Alcohol Screening Score (AUDIT): 0  ?Depression (PHQ2-9): Low Risk   ? PHQ-2 Score: 0  ?Financial Resource Strain: Low Risk   ? Difficulty of Paying Living Expenses: Not hard at all  ?Food Insecurity: No Food Insecurity  ? Worried About Charity fundraiser in the Last Year: Never true  ? Ran Out of Food in the Last Year: Never true  ?Housing: Low Risk   ? Last Housing Risk Score: 0  ?Physical Activity: Inactive  ? Days of Exercise per Week: 0 days  ? Minutes of Exercise per Session: 0 min  ?Social Connections: Socially Isolated  ? Frequency of Communication with Friends and Family: Three times a week  ? Frequency of Social Gatherings with Friends and Family: Three times a week  ? Attends Religious Services: Never  ? Active Member of Clubs or Organizations: No  ? Attends Archivist Meetings: Never  ? Marital Status: Divorced  ?Stress: No Stress Concern Present  ? Feeling of Stress : Not at all  ?Tobacco Use: High Risk  ? Smoking Tobacco Use: Every Day  ? Smokeless Tobacco Use: Never  ? Passive Exposure: Not on file  ?Transportation Needs: No Transportation Needs  ? Lack of Transportation (Medical): No  ? Lack of Transportation (Non-Medical): No  ?  ?Fall Risk  08/14/2021 06/13/2020  ?Falls in the past year? 0 0  ?Number falls in past yr: 0 0  ?Injury with Fall? 0 0  ?Risk for fall due to : No Fall Risks -  ?Follow up Falls evaluation completed -  ?  ?Depression screen [facility] 2/9 08/14/2021 06/16/2020  ?Decreased Interest 0 1  ?Down, Depressed,  Hopeless 0 2  ?PHQ - 2 Score 0 3  ?Altered sleeping 0 1  ?Tired, decreased energy 0 1  ?Change in appetite 0 2  ?Feeling bad or failure about yourself  0 1  ?Trouble concentrating 0 1  ?Moving slowly or fidgety/restless 0 0  ?Suicidal thoughts 0 0  ?PHQ-9 Score 0 9  ?Difficult doing work/chores Not difficult at all Very difficult  ?  ?Functional Status Survey: ?   ? ?Safety: reviewed ;  ?Patient wears a seat belt. ?Patient's home has smoke detectors and carbon monoxide detectors. ?Patient practices appropriate gun safety ?Patient wears sunscreen with extended sun exposure. ?Dental Care: biannual cleanings, brushes and flosses daily. ?Ophthalmology/Optometry: Annual visit.  ?Hearing loss: none ?Vision impairments: None ?Patient is not afflicted from Stress Incontinence and Urge Incontinence  ? ?Current Outpatient Medications on File Prior to Visit  ?Medication Sig Dispense Refill  ? acyclovir (ZOVIRAX) 200 MG capsule Take 200 mg by mouth daily.    ? metoprolol tartrate (LOPRESSOR) 25 MG tablet TAKE 1 TABLET (25 MG TOTAL) BY MOUTH 2 (TWO) TIMES DAILY. AS NEEDED. 180 tablet 0  ? Vitamin D, Ergocalciferol, (DRISDOL) 1.25 MG (50000 UNIT) CAPS capsule Take 1 capsule (50,000 Units total) by mouth once a week. (Patient not taking: Reported on 08/14/2021) 4 capsule 2  ? ?  No current facility-administered medications on file prior to visit.  ?  ?Social Hx   ?Social History  ? ?Socioeconomic History  ? Marital status: Divorced  ?  Spouse name: Not on file  ? Number of children: 3  ? Years of education: 79  ? Highest education level: Not on file  ?Occupational History  ? Occupation: [employer]  ?Tobacco Use  ? Smoking status: Every Day  ?  Packs/day: 1.00  ?  Types: Cigarettes, E-cigarettes  ?  Last attempt to quit: 2019  ?  Years since quitting: 4.2  ? Smokeless tobacco: Never  ?Vaping Use  ? Vaping Use: Never used  ?Substance and Sexual Activity  ? Alcohol use: Not Currently  ? Drug use: Never  ? Sexual activity: Not on file   ?Other Topics Concern  ? Not on file  ?Social History Narrative  ? ** Merged History Encounter **  ?    ? Drinks a couple cups of coffee a day, also may have a soda or glass of tea a day  ? ?Social Determinants of Health  ? ?Financial Resource Strain: Low Risk   ? Difficulty of Paying Living Expenses: Not hard at all  ?Food Insecurity: No Food Insecurity  ? Worried About Charity fundraiser in the Last Year: Never true  ? Ran Out of Food in the Last Year: Never true  ?Transportation Needs: No Transportation Needs  ? Lack of Transportation (Medical): No  ? Lack of Transportation (Non-Medical): No  ?Physical Activity: Inactive  ? Days of Exercise per Week: 0 days  ? Minutes of Exercise per Session: 0 min  ?Stress: No Stress Concern Present  ? Feeling of Stress : Not at all  ?Social Connections: Socially Isolated  ? Frequency of Communication with Friends and Family: Three times a week  ? Frequency of Social Gatherings with Friends and Family: Three times a week  ? Attends Religious Services: Never  ? Active Member of Clubs or Organizations: No  ? Attends Archivist Meetings: Never  ? Marital Status: Divorced  ? ?Past Medical History:  ?Diagnosis Date  ? Abnormal EKG 08/08/2019  ? Anxiety   ? Cardiac murmur 08/24/2019  ? Cervical stenosis of spine 11/06/2017  ? Depression   ? Ex-smoker 08/24/2019  ? Major depressive disorder, recurrent, moderate ([place])   ? Migraine without aura, not intractable, without status migrainosus   ? Moderate episode of recurrent major depressive disorder ([place]) 08/09/2019  ? Nausea 08/08/2019  ? Other fatigue 08/08/2019  ? Ovarian mass, right 01/01/2018  ? Palpitation 08/09/2019  ? Palpitations 08/24/2019  ? Prominent abdominal aortic pulsation 08/24/2019  ? Sinus tachycardia 08/24/2019  ? ?Family History  ?Problem Relation Age of Onset  ? Breast cancer Maternal Grandmother   ? ? ?Review of Systems  ?Constitutional:  Negative for chills, fatigue and fever.  ?HENT:  Negative for congestion, ear  pain, rhinorrhea and sore throat.   ?Respiratory:  Negative for cough and shortness of breath.   ?Cardiovascular:  Negative for chest pain.  ?Gastrointestinal:  Negative for abdominal pain, constipation, diarrhea, nausea and vomiting.  ?Genitourinary:  Negative for dysuria and urgency.  ?Musculoskeletal:  Negative for back pain and myalgias.  ?Neurological:  Negative for dizziness, weakness, light-headedness and headaches.  ?Psychiatric/Behavioral:  Negative for dysphoric mood. The patient is not nervous/anxious.   ? ? ?Objective:  ?Pulse 73   Temp (!) 97.2 ?F (36.2 ?C)   Ht '5\' 5"'$  (1.651 m)   Wt 127  lb (57.6 kg)   SpO2 100%   BMI 21.13 kg/m?  ? ?BP/Weight 09/02/2021 08/14/2021 07/26/2020  ?Systolic BP - 010 932  ?Diastolic BP - 68 68  ?Wt. (Lbs) 127 130 112  ?BMI 21.13 21.63 19.22  ? ? ?Physical Exam ? ?Lab Results  ?Component Value Date  ? WBC 5.4 08/14/2021  ? HGB 11.6 08/14/2021  ? HCT 34.5 08/14/2021  ? PLT 302 08/14/2021  ? GLUCOSE 82 08/14/2021  ? ALT 8 08/14/2021  ? AST 14 08/14/2021  ? NA 145 (H) 08/14/2021  ? K 4.4 08/14/2021  ? CL 104 08/14/2021  ? CREATININE 0.79 08/14/2021  ? BUN 12 08/14/2021  ? CO2 28 08/14/2021  ? TSH 1.470 08/14/2021  ? ? ? ? ?Assessment & Plan:  ?There are no diagnoses linked to this encounter.  ? ?No orders of the defined types were placed in this encounter. ? ? ?These are the goals we discussed: ? Goals   ?None ?  ?  ? ?This is a list of the screening recommended for you and due dates:  ?Health Maintenance  ?Topic Date Due  ? Pap Smear  04/21/2021  ? Mammogram  08/13/2021  ? Flu Shot  09/13/2021*  ? Colon Cancer Screening  08/15/2022*  ? Tetanus Vaccine  03/17/2027  ? HPV Vaccine  Aged Out  ? COVID-19 Vaccine  Discontinued  ? Hepatitis C Screening: USPSTF Recommendation to screen - Ages 54-79 yo.  Discontinued  ? HIV Screening  Discontinued  ?*Topic was postponed. The date shown is not the original due date.  ?  ? ? ?AN INDIVIDUALIZED CARE PLAN: was established or reinforced  today.   ?SELF MANAGEMENT: The patient and I together assessed ways to personally work towards obtaining the recommended goals  ?Support needs The patient and/or family needs were assessed and services were offered a

## 2021-09-02 NOTE — Progress Notes (Signed)
? ?Established Patient Office Visit ? ?Subjective:  ?Patient ID: Regina Duran, female    DOB: 22-Oct-1975  Age: 46 y.o. MRN: [id number] ? ?CC:  ?Chief Complaint  ?Patient presents with  ? Cervical cancer screening  ? ? ?HPI ?Regina Duran presents for cervical cancer screening. She had an annual exam on 08/14/21, deferred pap smear due to menses. She is fasting today for labs. Denies any acute medical problems. ? ?Past Medical History:  ?Diagnosis Date  ? Abnormal EKG 08/08/2019  ? Anxiety   ? Cardiac murmur 08/24/2019  ? Cervical stenosis of spine 11/06/2017  ? Depression   ? Ex-smoker 08/24/2019  ? Major depressive disorder, recurrent, moderate ([place])   ? Migraine without aura, not intractable, without status migrainosus   ? Moderate episode of recurrent major depressive disorder ([place]) 08/09/2019  ? Nausea 08/08/2019  ? Other fatigue 08/08/2019  ? Ovarian mass, right 01/01/2018  ? Palpitation 08/09/2019  ? Palpitations 08/24/2019  ? Prominent abdominal aortic pulsation 08/24/2019  ? Sinus tachycardia 08/24/2019  ? ? ?Past Surgical History:  ?Procedure Laterality Date  ? Oophroectomy Right 06/16/2017  ? Right ovary removal  ? TUBAL LIGATION  2005  ? TUBAL LIGATION Bilateral 06/17/2003  ? ? ?Family History  ?Problem Relation Age of Onset  ? Breast cancer Maternal Grandmother   ? ? ?Social History  ? ?Socioeconomic History  ? Marital status: Divorced  ?  Spouse name: Not on file  ? Number of children: 3  ? Years of education: 35  ? Highest education level: Not on file  ?Occupational History  ? Occupation: [employer]  ?Tobacco Use  ? Smoking status: Every Day  ?  Packs/day: 1.00  ?  Types: Cigarettes, E-cigarettes  ?  Last attempt to quit: 2019  ?  Years since quitting: 4.2  ? Smokeless tobacco: Never  ?Vaping Use  ? Vaping Use: Never used  ?Substance and Sexual Activity  ? Alcohol use: Not Currently  ? Drug use: Never  ? Sexual activity: Not on file  ?Other Topics Concern  ? Not on file  ?Social History Narrative  ? **  Merged History Encounter **  ?    ? Drinks a couple cups of coffee a day, also may have a soda or glass of tea a day  ? ?Social Determinants of Health  ? ?Financial Resource Strain: Low Risk   ? Difficulty of Paying Living Expenses: Not hard at all  ?Food Insecurity: No Food Insecurity  ? Worried About Charity fundraiser in the Last Year: Never true  ? Ran Out of Food in the Last Year: Never true  ?Transportation Needs: No Transportation Needs  ? Lack of Transportation (Medical): No  ? Lack of Transportation (Non-Medical): No  ?Physical Activity: Inactive  ? Days of Exercise per Week: 0 days  ? Minutes of Exercise per Session: 0 min  ?Stress: No Stress Concern Present  ? Feeling of Stress : Not at all  ?Social Connections: Socially Isolated  ? Frequency of Communication with Friends and Family: Three times a week  ? Frequency of Social Gatherings with Friends and Family: Three times a week  ? Attends Religious Services: Never  ? Active Member of Clubs or Organizations: No  ? Attends Archivist Meetings: Never  ? Marital Status: Divorced  ?Intimate Partner Violence: Not At Risk  ? Fear of Current or Ex-Partner: No  ? Emotionally Abused: No  ? Physically Abused: No  ? Sexually Abused: No  ? ? ?  Outpatient Medications Prior to Visit  ?Medication Sig Dispense Refill  ? acyclovir (ZOVIRAX) 200 MG capsule Take 200 mg by mouth daily.    ? metoprolol tartrate (LOPRESSOR) 25 MG tablet TAKE 1 TABLET (25 MG TOTAL) BY MOUTH 2 (TWO) TIMES DAILY. AS NEEDED. 180 tablet 0  ? Vitamin D, Ergocalciferol, (DRISDOL) 1.25 MG (50000 UNIT) CAPS capsule Take 1 capsule (50,000 Units total) by mouth once a week. (Patient not taking: Reported on 08/14/2021) 4 capsule 2  ? ?No facility-administered medications prior to visit.  ? ? ?Allergies  ?Allergen Reactions  ? Elemental Sulfur Nausea Only  ? Sulfa Antibiotics Nausea And Vomiting  ? ? ?ROS ?Review of Systems  ?Constitutional:  Positive for fatigue.  ?All other systems reviewed and  are negative. ? ?  ?Objective:  ?  ?Physical Exam ?Constitutional:   ?   Appearance: Normal appearance.  ?Neck:  ?   Vascular: No carotid bruit.  ?Cardiovascular:  ?   Rate and Rhythm: Normal rate and regular rhythm.  ?   Pulses: Normal pulses.  ?   Heart sounds: Normal heart sounds.  ?Pulmonary:  ?   Effort: Pulmonary effort is normal.  ?   Breath sounds: Normal breath sounds.  ?Abdominal:  ?   General: Bowel sounds are normal.  ?   Palpations: Abdomen is soft.  ?Genitourinary: ?   General: Normal vulva.  ?   Vagina: No vaginal discharge.  ?Musculoskeletal:     ?   General: No swelling.  ?Skin: ?   General: Skin is warm and dry.  ?   Capillary Refill: Capillary refill takes less than 2 seconds.  ?Neurological:  ?   General: No focal deficit present.  ?   Mental Status: She is alert and oriented to person, place, and time.  ?Psychiatric:     ?   Mood and Affect: Mood normal.     ?   Behavior: Behavior normal.  ? ? ?BP 118/80   Pulse 73   Temp (!) 97.2 ?F (36.2 ?C)   Ht '5\' 5"'  (1.651 m)   Wt 127 lb (57.6 kg)   SpO2 100%   BMI 21.13 kg/m?  ?Wt Readings from Last 3 Encounters:  ?09/02/21 127 lb (57.6 kg)  ?08/14/21 130 lb (59 kg)  ?07/26/20 112 lb (50.8 kg)  ? ? ? ?Health Maintenance Due  ?Topic Date Due  ? PAP SMEAR-Modifier  04/21/2021  ? MAMMOGRAM  08/13/2021  ? ? ? ? ?Lab Results  ?Component Value Date  ? TSH 1.470 08/14/2021  ? ?Lab Results  ?Component Value Date  ? WBC 5.4 08/14/2021  ? HGB 11.6 08/14/2021  ? HCT 34.5 08/14/2021  ? MCV 91 08/14/2021  ? PLT 302 08/14/2021  ? ?Lab Results  ?Component Value Date  ? NA 145 (H) 08/14/2021  ? K 4.4 08/14/2021  ? CO2 28 08/14/2021  ? GLUCOSE 82 08/14/2021  ? BUN 12 08/14/2021  ? CREATININE 0.79 08/14/2021  ? BILITOT <0.2 08/14/2021  ? ALKPHOS 59 08/14/2021  ? AST 14 08/14/2021  ? ALT 8 08/14/2021  ? PROT 6.9 08/14/2021  ? ALBUMIN 4.5 08/14/2021  ? CALCIUM 9.3 08/14/2021  ? ANIONGAP 12 05/09/2014  ? EGFR 93 08/14/2021  ? ?No results found for: CHOL ?No results found  for: HDL ?No results found for: [place] ? ?No results found for: CHOLHDL ?No results found for: HGBA1C ? ?  ?Assessment & Plan:  ? ?1. Pap smear for cervical cancer screening ?- IGP, Aptima HPV,  rfx 16/18,45 ? ?2. Family history of hyperlipidemia ?- Lipid Panel ?  ? ?Continue medications ?We will call you with lab results ?Follow-up in 1-year or sooner if needed ? ? ? ?Follow-up: Return in about 1 year (around 09/03/2022) for cpe.  ? ? ?I, Rip Harbour, NP, have reviewed all documentation for this visit. The documentation on 09/02/21 for the exam, diagnosis, procedures, and orders are all accurate and complete.  ? ? ?Signed, ?Rip Harbour, NP ?

## 2021-09-03 LAB — LIPID PANEL
Chol/HDL Ratio: 2.2 ratio (ref 0.0–4.4)
Cholesterol, Total: 153 mg/dL (ref 100–199)
HDL: 69 mg/dL (ref >39)
LDL Chol Calc (NIH): 73 mg/dL (ref 0–99)
Triglycerides: 50 mg/dL (ref 0–149)
VLDL Cholesterol Cal: 11 mg/dL (ref 5–40)

## 2021-09-03 LAB — CARDIOVASCULAR RISK ASSESSMENT

## 2021-09-04 ENCOUNTER — Other Ambulatory Visit: Payer: Self-pay | Admitting: Nurse Practitioner

## 2021-09-04 DIAGNOSIS — Z1231 Encounter for screening mammogram for malignant neoplasm of breast: Secondary | ICD-10-CM

## 2021-09-05 LAB — IGP, APTIMA HPV, RFX 16/18,45
HPV Aptima: POSITIVE — AB
PAP Smear Comment: 0

## 2021-09-11 ENCOUNTER — Ambulatory Visit
Admission: RE | Admit: 2021-09-11 | Discharge: 2021-09-11 | Disposition: A | Discharge status: Home / Self Care | Payer: 59 | Source: Ambulatory Visit | Attending: Nurse Practitioner | Admitting: Nurse Practitioner

## 2021-09-11 DIAGNOSIS — Z1231 Encounter for screening mammogram for malignant neoplasm of breast: Secondary | ICD-10-CM

## 2022-01-06 ENCOUNTER — Other Ambulatory Visit: Payer: Self-pay | Admitting: Nurse Practitioner

## 2022-01-06 DIAGNOSIS — E559 Vitamin D deficiency, unspecified: Secondary | ICD-10-CM

## 2022-02-17 ENCOUNTER — Other Ambulatory Visit: Payer: Self-pay | Admitting: Nurse Practitioner

## 2022-02-17 ENCOUNTER — Telehealth: Payer: Self-pay | Admitting: Nurse Practitioner

## 2022-02-17 NOTE — Telephone Encounter (Signed)
Telephoned pt concerning refill request for Wellbutrin 300 mg. Drug d/c 08/14/21. Left message for pt to return call to clarify if she wants to resume medication.

## 2022-02-18 ENCOUNTER — Other Ambulatory Visit: Payer: Self-pay | Admitting: Nurse Practitioner

## 2022-02-18 DIAGNOSIS — F331 Major depressive disorder, recurrent, moderate: Secondary | ICD-10-CM

## 2022-02-18 MED ORDER — BUPROPION HCL ER (XL) 300 MG PO TB24: 300.0000 mg | ORAL_TABLET | Freq: Every day | ORAL | 1 refills | Status: AC

## 2022-02-18 NOTE — Telephone Encounter (Signed)
Patient called back and stated that Viibryd was d/c, due to insurance not covering it but she is still taking Wellbutrin 300 daily.

## 2022-03-31 ENCOUNTER — Telehealth: Payer: Self-pay

## 2022-03-31 NOTE — Telephone Encounter (Signed)
Patient called and stated she is still having hip pain and you have her on Vit D, it was helping at first but now it is not and wanted to know if you could try something else for her hip pain. Please advise.

## 2022-03-31 NOTE — Telephone Encounter (Signed)
Called and left patient detailed message to call office and schedule appointment.

## 2022-04-02 ENCOUNTER — Ambulatory Visit: Payer: 59 | Admitting: Nurse Practitioner

## 2022-04-02 ENCOUNTER — Encounter: Payer: Self-pay | Admitting: Nurse Practitioner

## 2022-04-02 VITALS — BP 122/70 | HR 74 | Temp 96.9°F | Ht 65.0 in | Wt 137.0 lb

## 2022-04-02 DIAGNOSIS — M25552 Pain in left hip: Secondary | ICD-10-CM

## 2022-04-02 DIAGNOSIS — M25551 Pain in right hip: Secondary | ICD-10-CM

## 2022-04-02 DIAGNOSIS — M160 Bilateral primary osteoarthritis of hip: Secondary | ICD-10-CM
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
## 2022-04-02 NOTE — Patient Instructions (Signed)
Alternate heat and ice to bilateral hips Obtain bilateral x-rays to bilateral hips at [facility]  Alternate Ibuprofen and Tylenol  Follow-up pending hip x-rays Hip Pain The hip is the joint between the upper legs and the lower pelvis. The bones, cartilage, tendons, and muscles of your hip joint support your body and allow you to move around. Hip pain can range from a minor ache to severe pain in one or both of your hips. The pain may be felt on the inside of the hip joint near the groin, or on the outside near the buttocks and upper thigh. You may also have swelling or stiffness in your hip area. Follow these instructions at home: Managing pain, stiffness, and swelling     If directed, put ice on the painful area. To do this: Put ice in a plastic bag. Place a towel between your skin and the bag. Leave the ice on for 20 minutes, 2-3 times a day. If directed, apply heat to the affected area as often as told by your health care provider. Use the heat source that your health care provider recommends, such as a moist heat pack or a heating pad. Place a towel between your skin and the heat source. Leave the heat on for 20-30 minutes. Remove the heat if your skin turns bright red. This is especially important if you are unable to feel pain, heat, or cold. You may have a greater risk of getting burned. Activity Do exercises as told by your health care provider. Avoid activities that cause pain. General instructions  Take over-the-counter and prescription medicines only as told by your health care provider. Keep a journal of your symptoms. Write down: How often you have hip pain. The location of your pain. What the pain feels like. What makes the pain worse. Sleep with a pillow between your legs on your most comfortable side. Keep all follow-up visits as told by your health care provider. This is important. Contact a health care provider if: You cannot put weight on your leg. Your  pain or swelling continues or gets worse after one week. It gets harder to walk. You have a fever. Get help right away if: You fall. You have a sudden increase in pain and swelling in your hip. Your hip is red or swollen or very tender to touch. Summary Hip pain can range from a minor ache to severe pain in one or both of your hips. The pain may be felt on the inside of the hip joint near the groin, or on the outside near the buttocks and upper thigh. Avoid activities that cause pain. Write down how often you have hip pain, the location of the pain, what makes it worse, and what it feels like. This information is not intended to replace advice given to you by your health care provider. Make sure you discuss any questions you have with your health care provider. Document Revised: 10/18/2018 Document Reviewed: 10/18/2018 [employer] Patient Education  [place].

## 2022-04-08 ENCOUNTER — Other Ambulatory Visit: Payer: Self-pay

## 2022-04-08 DIAGNOSIS — M25551 Pain in right hip: Secondary | ICD-10-CM

## 2022-04-09 ENCOUNTER — Encounter: Payer: Self-pay | Admitting: Nurse Practitioner

## 2022-10-27 ENCOUNTER — Other Ambulatory Visit: Payer: Self-pay | Admitting: Medical

## 2022-10-27 DIAGNOSIS — Z1231 Encounter for screening mammogram for malignant neoplasm of breast: Secondary | ICD-10-CM

## 2022-10-29 ENCOUNTER — Ambulatory Visit
Admission: RE | Admit: 2022-10-29 | Discharge: 2022-10-29 | Disposition: A | Discharge status: Home / Self Care | Payer: 59 | Source: Ambulatory Visit | Attending: Medical | Admitting: Medical

## 2022-10-29 DIAGNOSIS — Z1231 Encounter for screening mammogram for malignant neoplasm of breast: Secondary | ICD-10-CM

## 2022-11-19 ENCOUNTER — Encounter: Payer: 59 | Admitting: Physician Assistant

## 2023-12-16 ENCOUNTER — Other Ambulatory Visit: Payer: Self-pay | Admitting: Medical

## 2023-12-16 DIAGNOSIS — Z0000 Encounter for general adult medical examination without abnormal findings: Secondary | ICD-10-CM

## 2023-12-23 ENCOUNTER — Ambulatory Visit
Admission: RE | Admit: 2023-12-23 | Discharge: 2023-12-23 | Disposition: A | Discharge status: Home / Self Care | Source: Ambulatory Visit | Attending: Medical | Admitting: Medical

## 2023-12-23 DIAGNOSIS — Z0000 Encounter for general adult medical examination without abnormal findings: Secondary | ICD-10-CM
